# Patient Record
Sex: Female | Born: 1940 | Race: White | Hispanic: No | Smoking: Never smoker
Health system: Southern US, Community
[De-identification: ages and names within clinical notes are randomized; demographics above are authoritative.]

## PROBLEM LIST (undated history)

## (undated) DIAGNOSIS — E079 Disorder of thyroid, unspecified: Secondary | ICD-10-CM

## (undated) DIAGNOSIS — I341 Nonrheumatic mitral (valve) prolapse: Secondary | ICD-10-CM

## (undated) DIAGNOSIS — I480 Paroxysmal atrial fibrillation: Secondary | ICD-10-CM

## (undated) DIAGNOSIS — N289 Disorder of kidney and ureter, unspecified: Secondary | ICD-10-CM

## (undated) DIAGNOSIS — I10 Essential (primary) hypertension: Secondary | ICD-10-CM

## (undated) DIAGNOSIS — Z8679 Personal history of other diseases of the circulatory system: Secondary | ICD-10-CM

## (undated) DIAGNOSIS — I340 Nonrheumatic mitral (valve) insufficiency: Secondary | ICD-10-CM

## (undated) HISTORY — DX: Paroxysmal atrial fibrillation: I48.0

## (undated) HISTORY — DX: Essential (primary) hypertension: I10

## (undated) HISTORY — DX: Nonrheumatic mitral (valve) prolapse: I34.1

## (undated) HISTORY — DX: Disorder of thyroid, unspecified: E07.9

## (undated) HISTORY — PX: ABDOMINAL HYSTERECTOMY: SHX81

## (undated) HISTORY — DX: Personal history of other diseases of the circulatory system: Z86.79

## (undated) HISTORY — DX: Nonrheumatic mitral (valve) insufficiency: I34.0

---

## 2009-02-13 ENCOUNTER — Ambulatory Visit: Payer: Self-pay | Admitting: Thoracic Surgery (Cardiothoracic Vascular Surgery)

## 2009-02-21 ENCOUNTER — Encounter
Admission: RE | Admit: 2009-02-21 | Discharge: 2009-02-21 | Payer: Self-pay | Admitting: Thoracic Surgery (Cardiothoracic Vascular Surgery)

## 2009-02-21 IMAGING — CT CT ANGIO CHEST
1 of 6 series · 11 of 30 positions shown · IV contrast ([ID] OMNI 350)
Comparison: Nine

CTA CHEST

CLINICAL DATA: Preop for mitral valve replacement.  Evaluate for
aneurysm, peripheral arterial disease.  Cough, shortness of breath.
Nonsmoker.

CT ANGIOGRAPHY CHEST, ABDOMEN AND PELVIS
TECHNIQUE: Multidetector CT imaging through the chest, abdomen and
pelvis was performed using the standard protocol during bolus
administration of intravenous contrast. Multiplanar reconstructed
images including MIPs were obtained and reviewed to evaluate the
vascular anatomy.
Contrast: 100 ml Omnipaque 350 IV

[Series 5: angio · axial · 0.64mm/px · z∈[-492,-19]mm · 11 of 227 slices shown]
[im 19/227  lung]
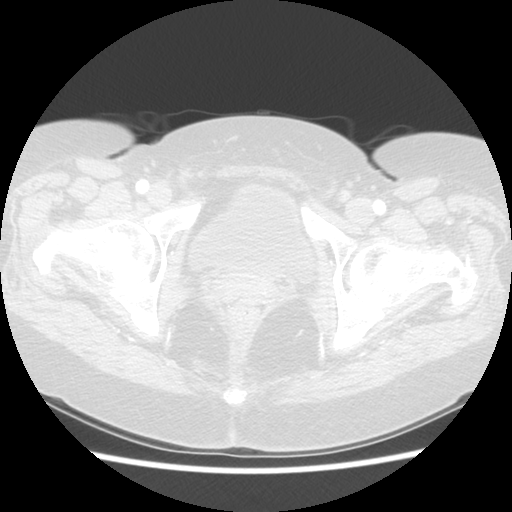
[im 38/227  mediastinal]
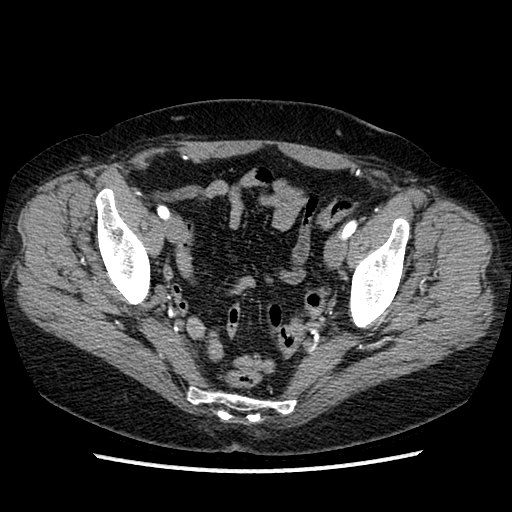
[im 57/227  lung]
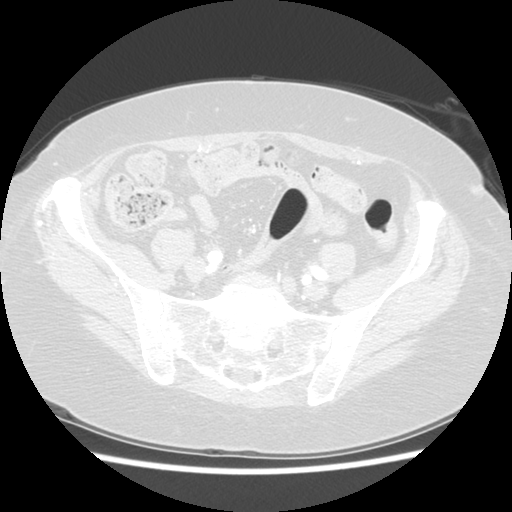
[im 76/227  mediastinal]
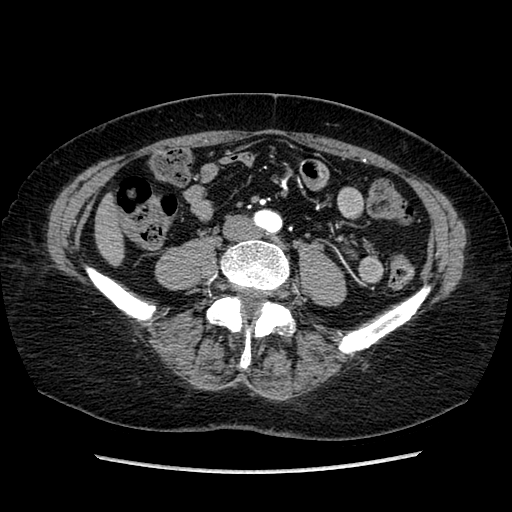
[im 95/227  lung]
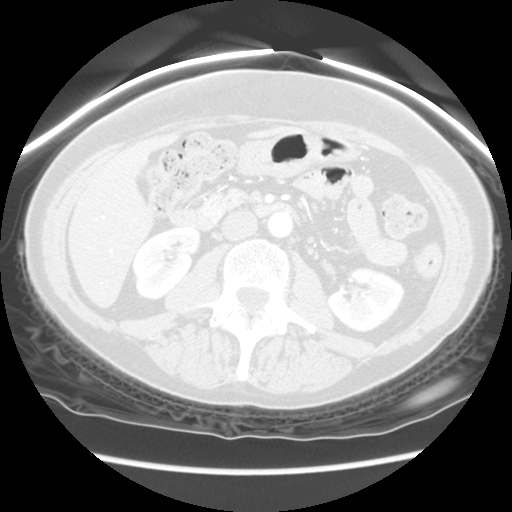
[im 114/227  mediastinal]
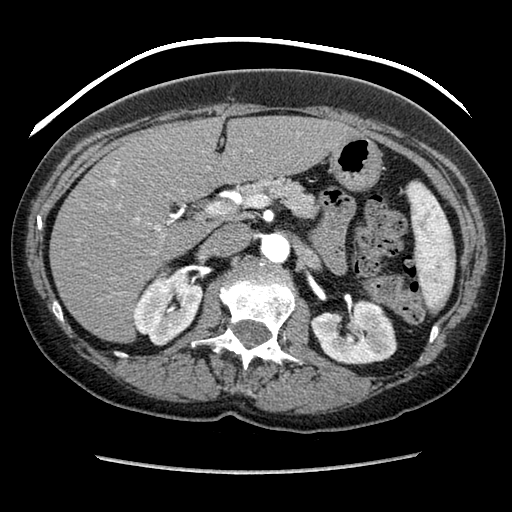
[im 132/227  lung]
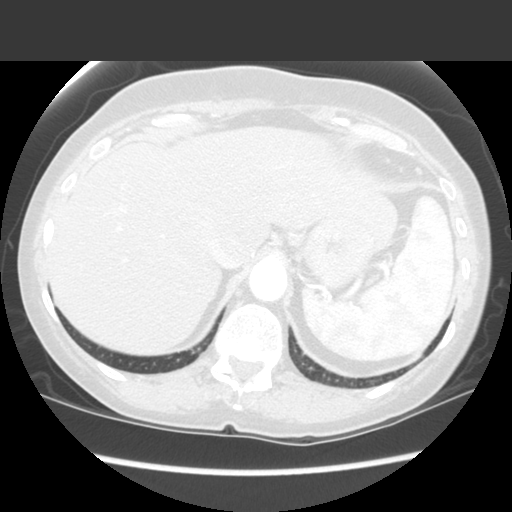
[im 151/227  mediastinal]
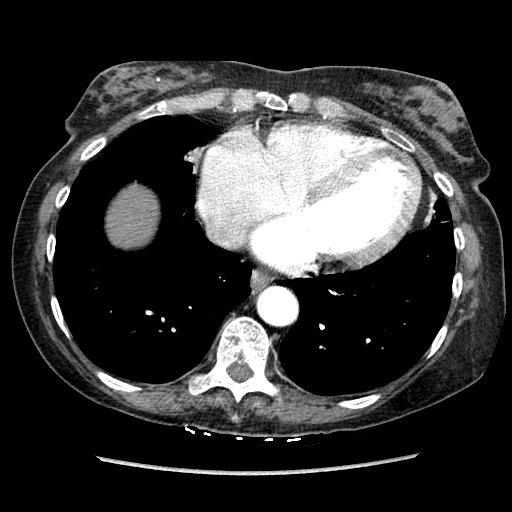
[im 170/227  lung]
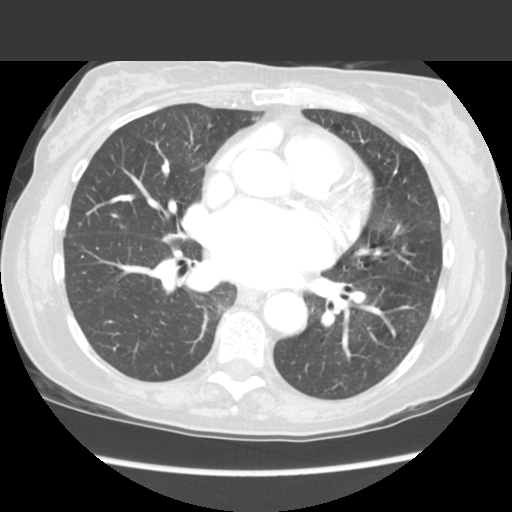
[im 189/227  mediastinal]
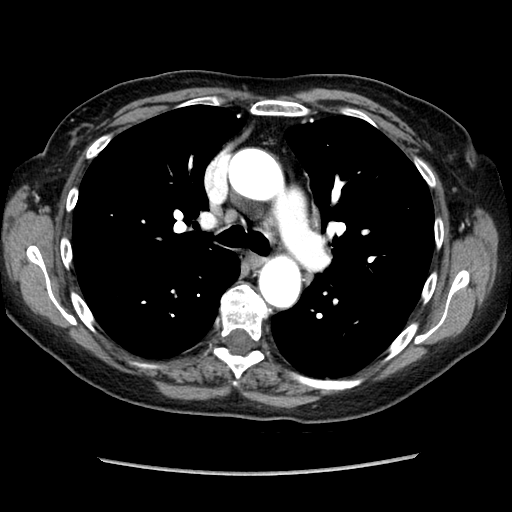
[im 208/227  lung]
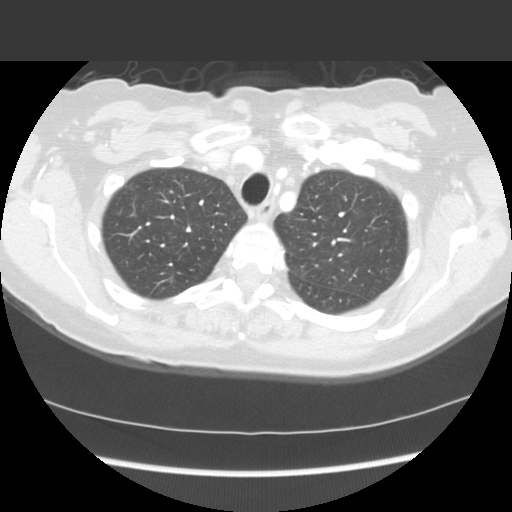

[11 of 30 positions shown; findings below may reference images not displayed]

FINDINGS: The thoracic aorta is nonaneurysmal.  Maximum diameter
of the ascending thoracic aorta is 3.0 cm.  Maximal diameter of the
aortic arch is 2.8 cm.  Maximal diameter of the descending thoracic
aorta is 2.6 cm.  No dissection. No significant atherosclerotic
disease.

There is mild cardiomegaly.  No filling defects in the pulmonary
arteries to suggest pulmonary emboli.  No effusions. No
mediastinal, hilar, or axillary adenopathy.  Small scattered
mediastinal lymph nodes, none pathologically enlarged.

Minimal ground-glass opacities noted in the lingula and in the
right azygo-esophageal recess, likely scarring or atelectasis.  No
acute infiltrates.

 Review of the MIP images confirms the above findings.
IMPRESSION: Thoracic aorta is nonaneurysmal.  No dissection.

No acute findings in the chest.

CTA ABDOMEN
FINDINGS: 2 right renal arteries and one left renal artery noted,
widely patent.  Celiac artery, superior mesenteric artery, inferior
mesenteric artery widely patent.  Abdominal aorta is nonaneurysmal.
Maximum diameter is proximal at 2.2 cm.  No dissection. No
significant atherosclerotic change.

Liver, spleen, pancreas, adrenals, kidneys unremarkable. No
hydronephrosis. No biliary ductal dilatation. Gallbladder
unremarkable. Bowel grossly unremarkable.  No free fluid, free air,
or adenopathy.

No acute bony abnormality.  Degenerative changes in the lumbar
spine.

 Review of the MIP images confirms the above findings.
IMPRESSION: No evidence of aortic aneurysm or dissection.

No acute findings.

CTA PELVIS
FINDINGS: Common iliac arteries, internal iliac arteries, external
iliac arteries, common femoral arteries widely patent.  No aneurysm
or dissection. No significant atherosclerotic disease.

Bowel grossly unremarkable.  No free fluid, free air, or
adenopathy. The patient is status post hysterectomy.  No adnexal
masses.  Bladder unremarkable.

 Review of the MIP images confirms the above findings.
IMPRESSION: No acute findings in the pelvis.

## 2009-02-25 ENCOUNTER — Encounter: Payer: Self-pay | Admitting: Thoracic Surgery (Cardiothoracic Vascular Surgery)

## 2009-02-25 ENCOUNTER — Ambulatory Visit: Payer: Self-pay | Admitting: Thoracic Surgery (Cardiothoracic Vascular Surgery)

## 2009-02-25 IMAGING — CR DG CHEST 2V
2 series · 2 of 2 positions shown · non-contrast
Comparison: CT chest [DATE]

CLINICAL DATA: Preop mitral valve repair.

CHEST - 2 VIEW

[view not recorded (1 of 2)]
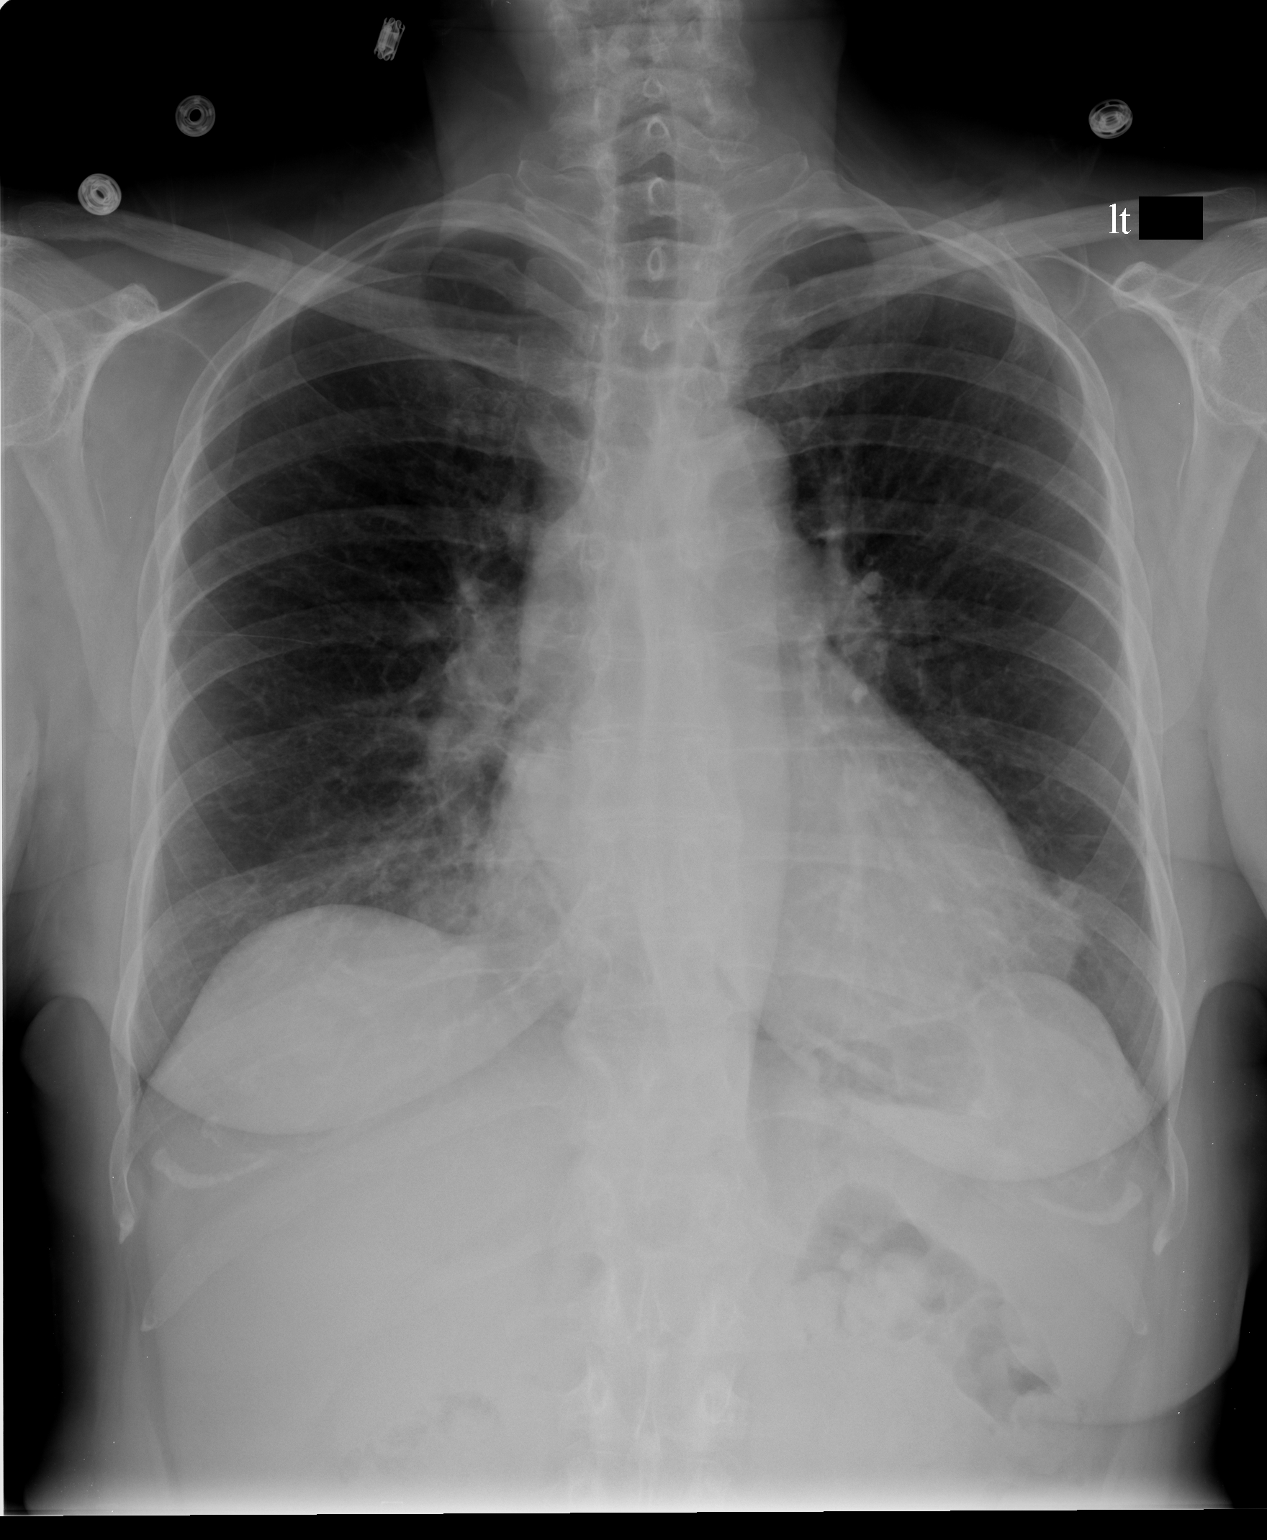

[view not recorded (2 of 2)]
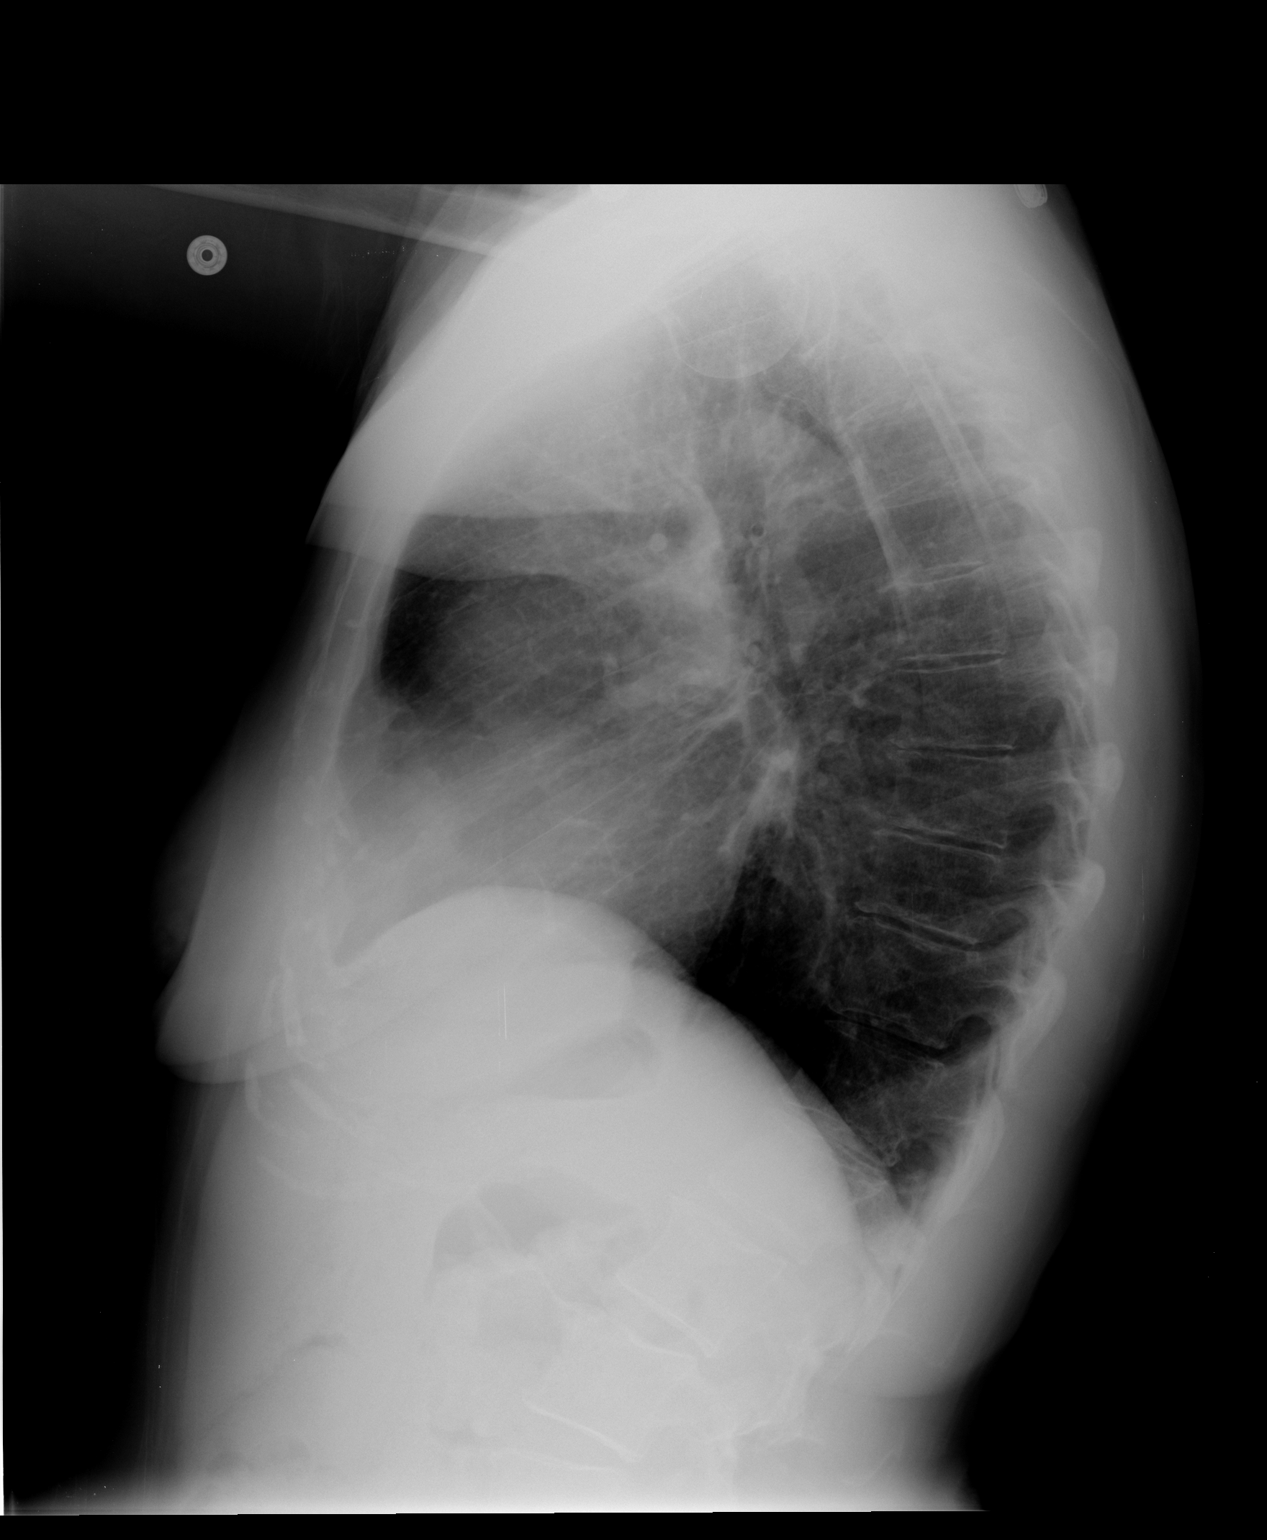

[2 of 2 positions shown; findings below may reference images not displayed]

FINDINGS: Trachea is midline.  Heart is enlarged.  Pulmonary
arteries are prominent.  Minimal bibasilar atelectasis and/or
scarring. No pleural fluid.
IMPRESSION: 1.  Minimal dependent atelectasis and/or scarring.
2.  Pulmonary arterial hypertension.

## 2009-02-28 ENCOUNTER — Ambulatory Visit: Payer: Self-pay | Admitting: Cardiovascular Disease

## 2009-02-28 ENCOUNTER — Encounter: Payer: Self-pay | Admitting: Thoracic Surgery (Cardiothoracic Vascular Surgery)

## 2009-02-28 ENCOUNTER — Ambulatory Visit: Payer: Self-pay | Admitting: Thoracic Surgery (Cardiothoracic Vascular Surgery)

## 2009-02-28 ENCOUNTER — Inpatient Hospital Stay (HOSPITAL_COMMUNITY)
Admission: RE | Admit: 2009-02-28 | Discharge: 2009-03-06 | Payer: Self-pay | Admitting: Thoracic Surgery (Cardiothoracic Vascular Surgery)

## 2009-02-28 ENCOUNTER — Ambulatory Visit: Payer: Self-pay | Admitting: Cardiology

## 2009-02-28 HISTORY — PX: COX-MAZE MICROWAVE ABLATION: SHX1404

## 2009-02-28 IMAGING — CR DG CHEST 1V PORT
1 series · 1 of 1 positions shown · non-contrast
Comparison: Chest radiograph performed [DATE].

CLINICAL DATA: Chest tube placement status post CABG; mitral
regurgitation and atrial fibrillation.

PORTABLE CHEST - 1 VIEW

[view not recorded]
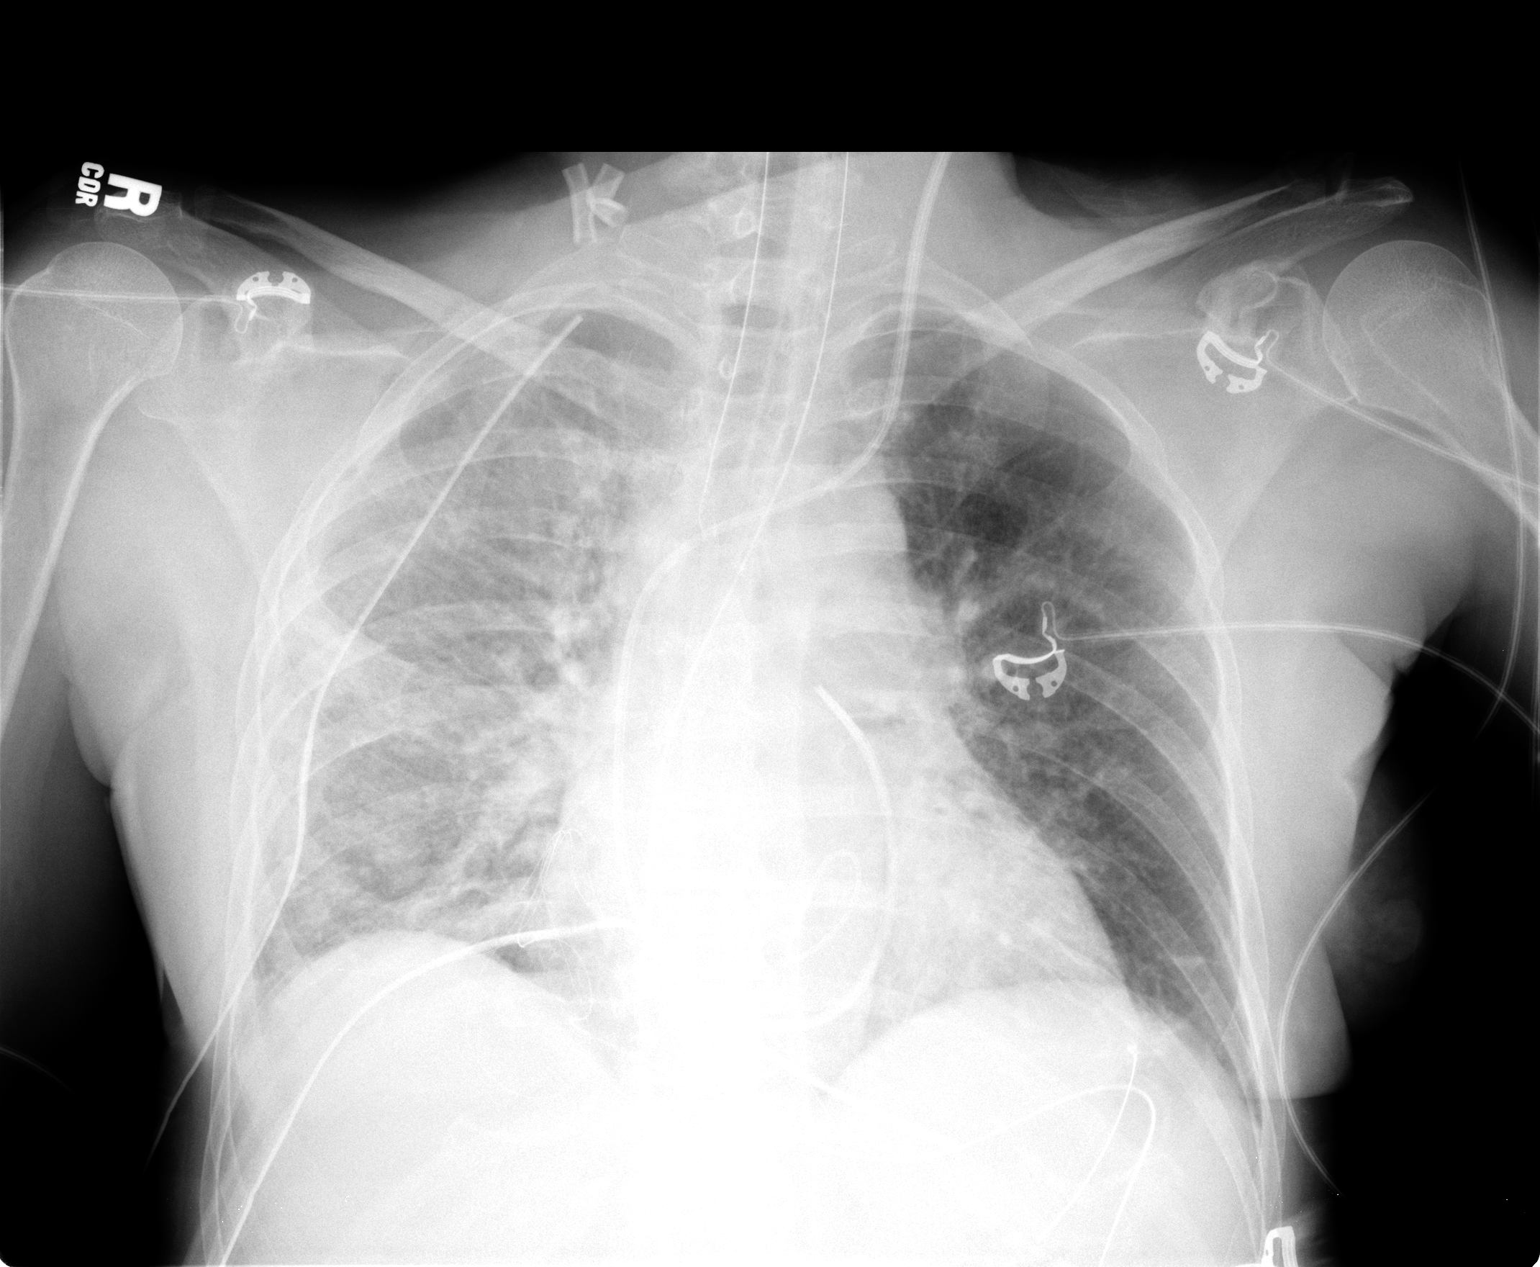

[1 of 1 positions shown; findings below may reference images not displayed]

FINDINGS: There has been interval placement of an endotracheal
tube, which is seen ending 1-2 cm above the carina.  A right-sided
chest tube and a pericardial drain are seen, ending overlying the
right lung apex and cardiac apex.  A left IJ Swan-Ganz catheter is
noted ending at the pulmonary outflow tract.  A nasogastric tube is
seen extending into the body of the stomach.

A mitral valve replacement is appreciated.  External pacer wires
are noted.

The lungs are mildly hypoexpanded.  The patient was imaged supine;
diffusely increased density of the right hemithorax likely reflects
a small right-sided layering pleural effusion.  There is mild
prominence of the pulmonary vasculature in comparison to the
preoperative study, which suggests mild interstitial edema.  The
degree of vascular prominence is mildly more accentuated on the
right side.  No definite pneumothorax is identified. No prominent
focal consolidation is seen.

The cardiomediastinal silhouette is stable in appearance.  No acute
osseous abnormalities are seen.
IMPRESSION: 1.  Endotracheal tube seen ending 1-2 cm above the carina.
2.  Right apical chest tube and pericardial drain noted in expected
positions.
3.  Left IJ Swan-Ganz catheter seen ending in the pulmonary outflow
tract.
4.  New layering right-sided pleural effusion; mild interstitial
edema, more prominent on the right side.
5.  No evidence of pneumothorax or focal consolidation.

## 2009-03-01 ENCOUNTER — Encounter: Payer: Self-pay | Admitting: Thoracic Surgery (Cardiothoracic Vascular Surgery)

## 2009-03-01 IMAGING — CR DG CHEST 1V PORT
1 series · 1 of 1 positions shown · non-contrast
Comparison: [DATE]

CLINICAL DATA: Right interstitial edema.  Status post mitral valve
replacement.

PORTABLE CHEST - 1 VIEW

[AP]
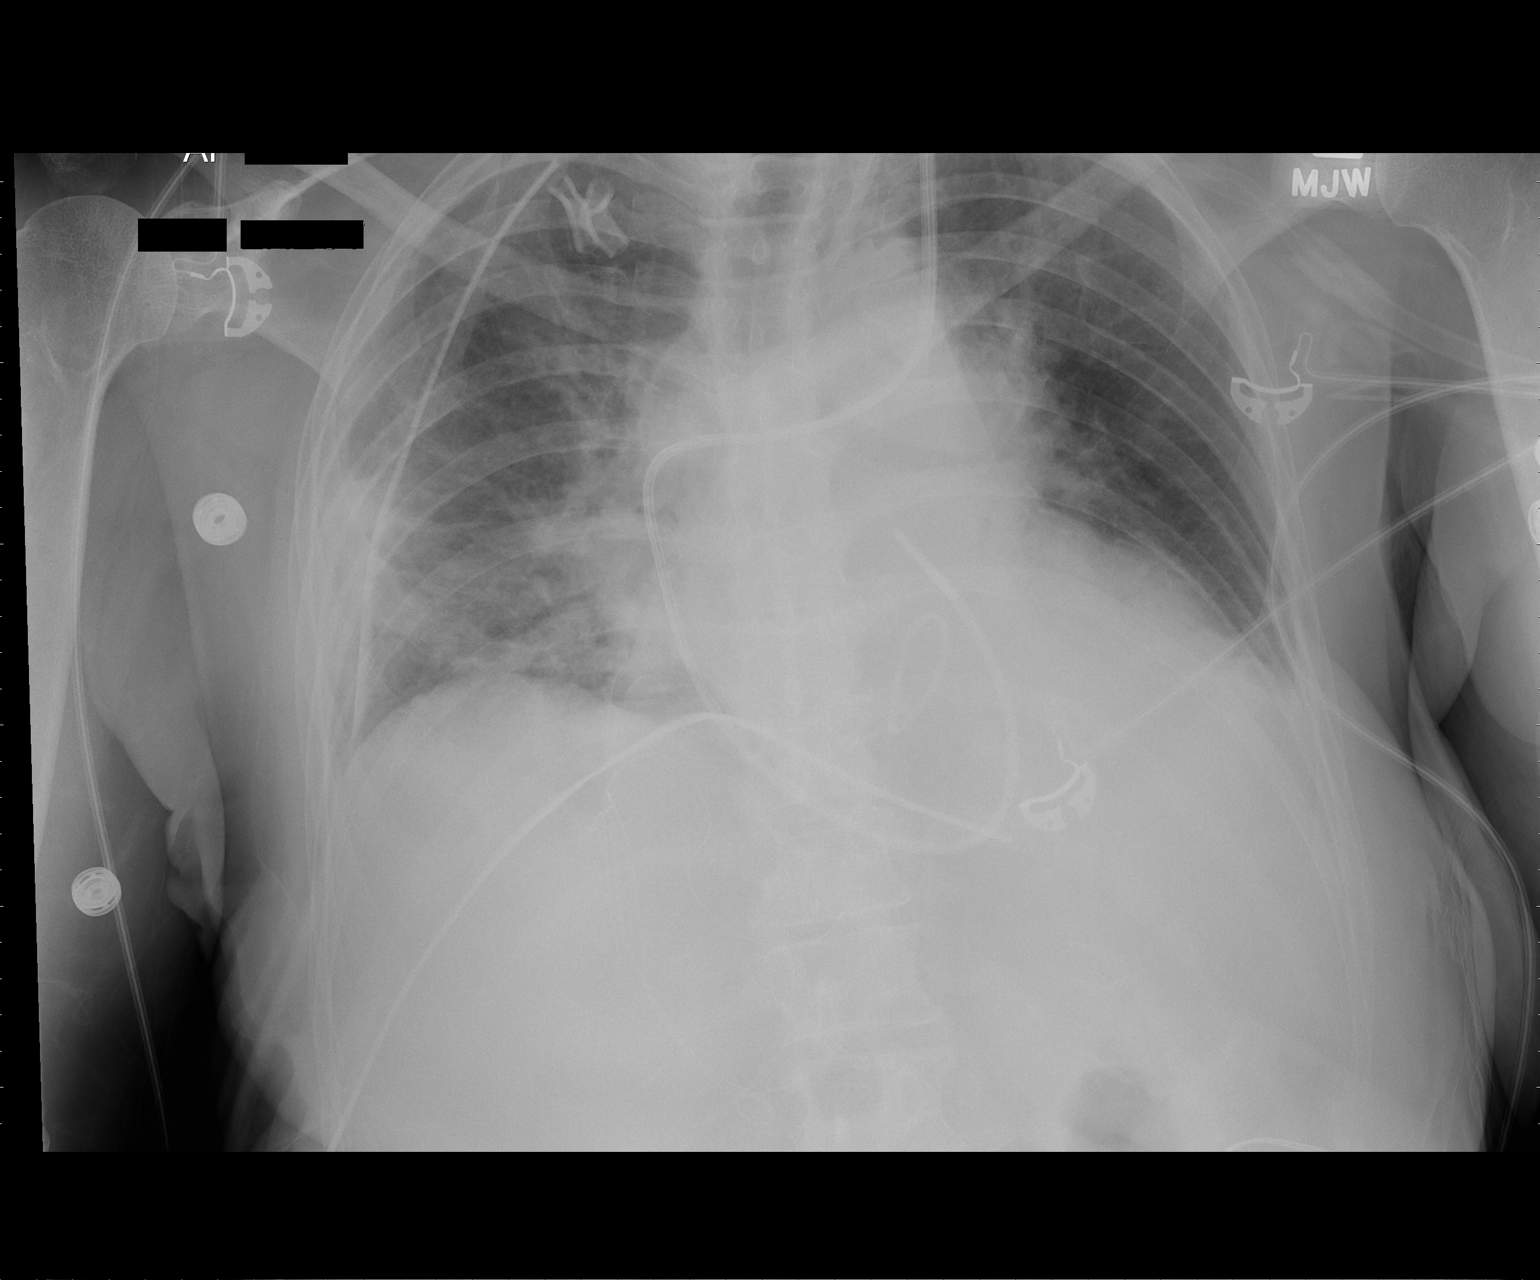

[1 of 1 positions shown; findings below may reference images not displayed]

FINDINGS: The endotracheal tube and NG tube have been removed.
Swan-Ganz catheter tip remains in the main pulmonary artery.  Right-
sided chest tube is unchanged with no pneumothorax.

The interstitial edema has resolved on the right. There is mild
atelectasis in the right  mid and lower lung zones.

The patient has developed a small left effusion with slight
atelectasis at the left base.
IMPRESSION: 1.  New small left effusion and left base atelectasis.
2.  Resolution of the right sided interstitial edema.
3.  Atelectasis in the right mid and lower lung zones.

## 2009-03-02 IMAGING — CR DG CHEST 1V PORT
1 series · 1 of 1 positions shown · non-contrast
Comparison: Portable chest x-ray of [DATE]

CLINICAL DATA: CABG, follow-up

PORTABLE CHEST - 1 VIEW

[AP]
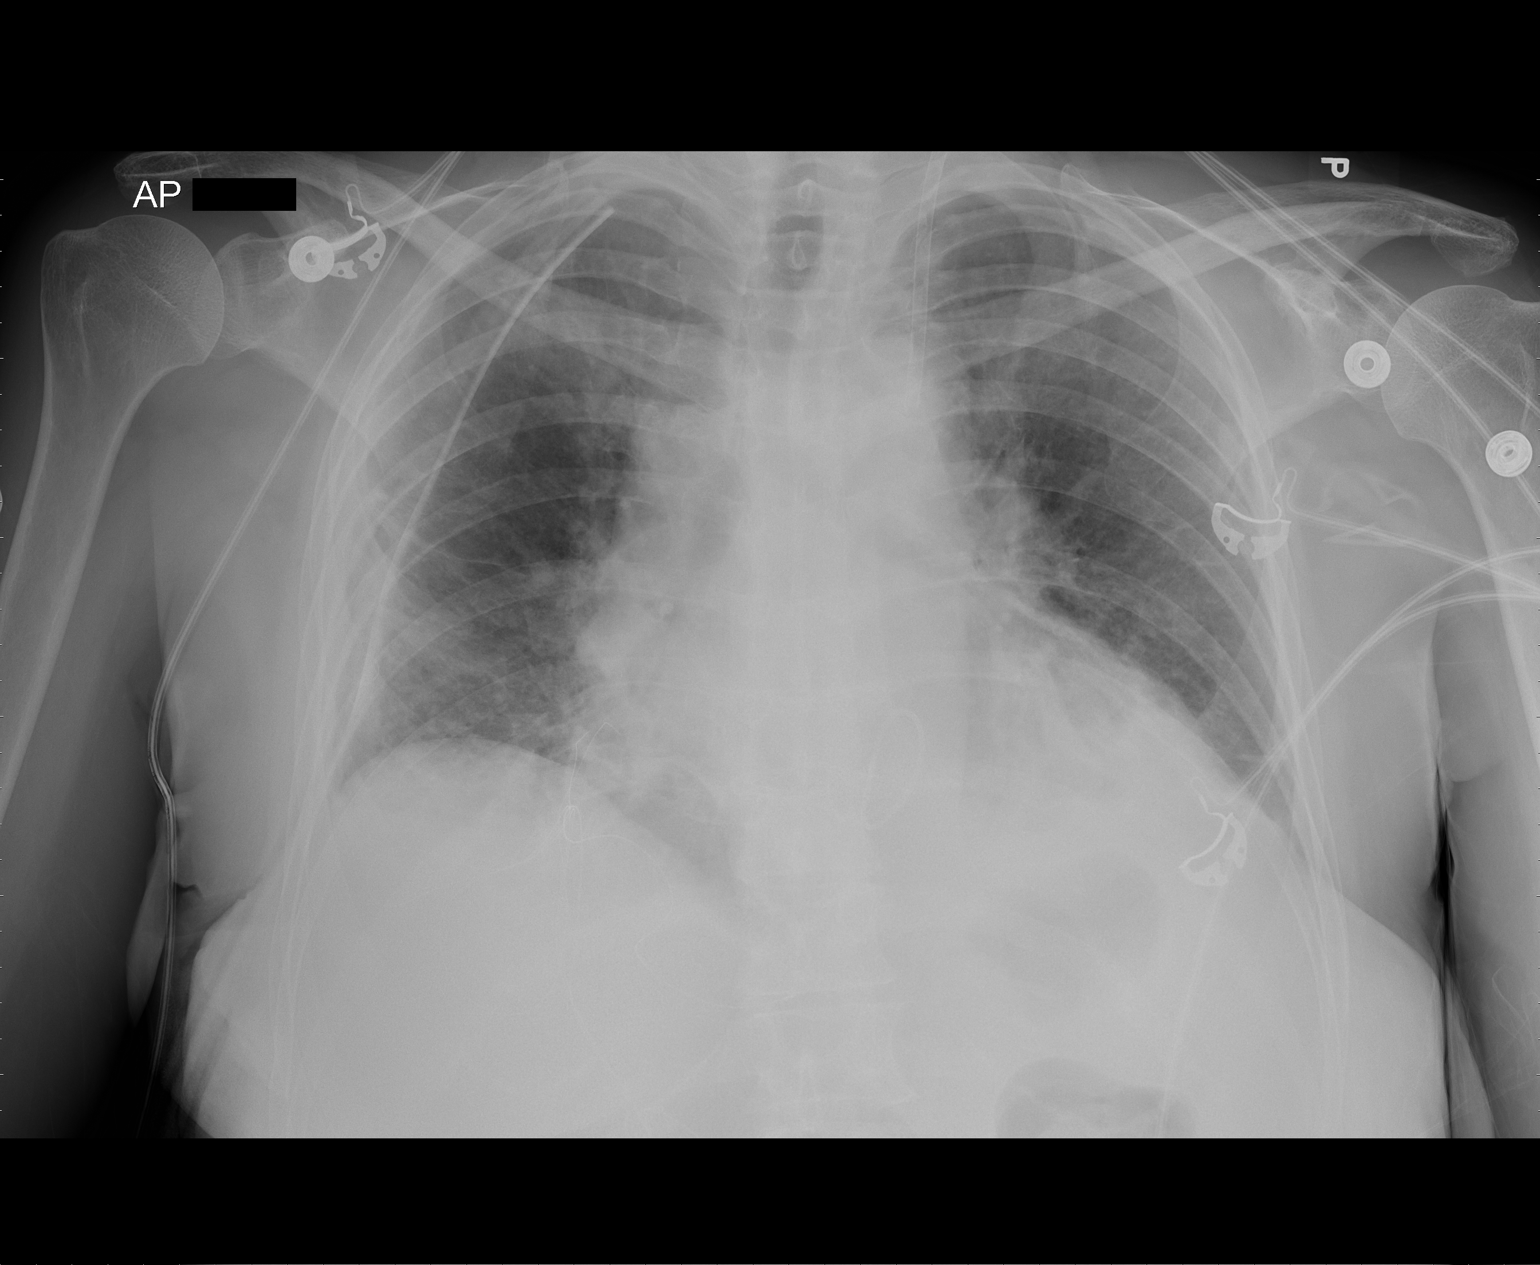

[1 of 1 positions shown; findings below may reference images not displayed]

FINDINGS: Aeration has improved slightly.  The Swan-Ganz catheter
has been removed.  A right chest tube remains and no pneumothorax
is seen.  A venous sheath remains in the left internal jugular vein
IMPRESSION: 1.  Improved aeration.
2.  Swan-Ganz catheter removed.
3.  No pneumothorax.

## 2009-03-03 IMAGING — CR DG CHEST 1V PORT
1 series · 1 of 1 positions shown · non-contrast
Comparison: [DATE]/[PHONE_NUMBER] hours

CLINICAL DATA: Mitral regurgitation

PORTABLE CHEST - 1 VIEW

[AP]
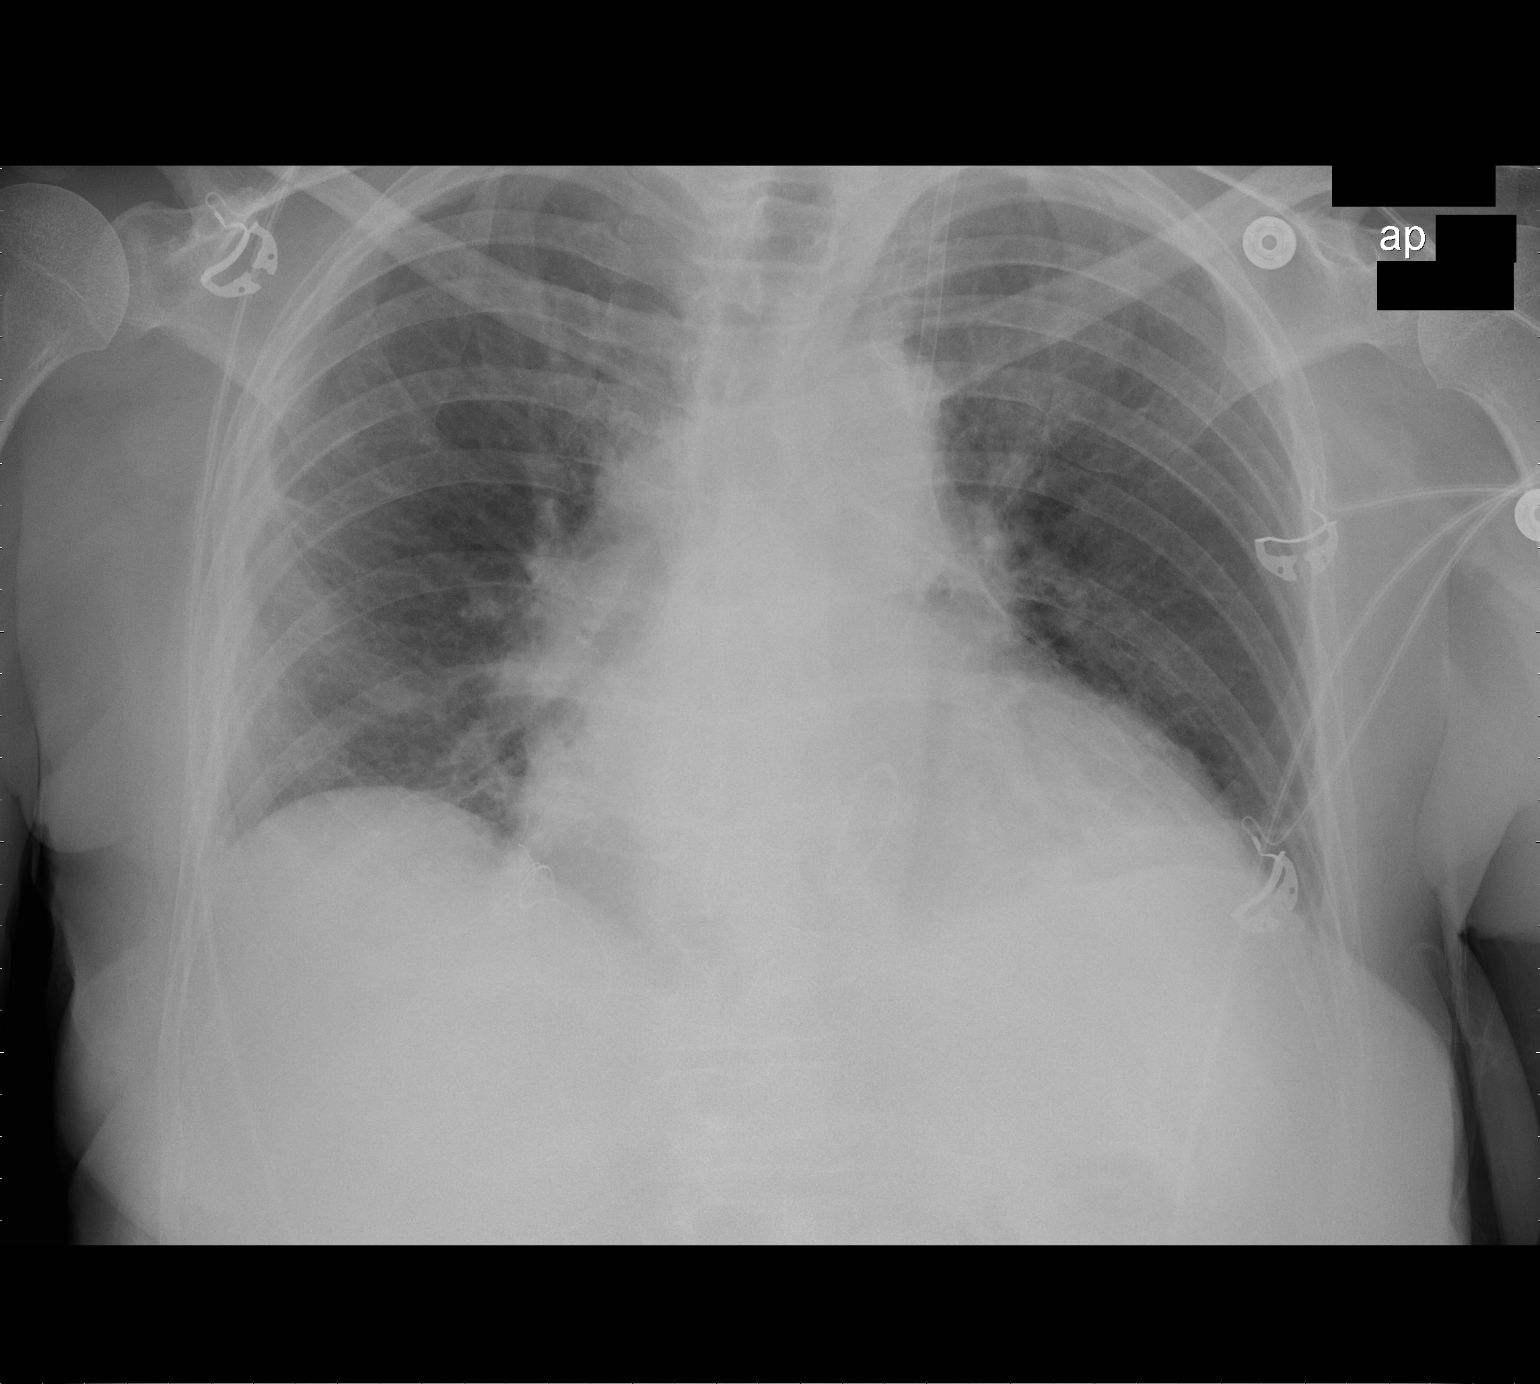

[1 of 1 positions shown; findings below may reference images not displayed]

FINDINGS: Right chest tube removal.  No pneumothorax.
Cardiomegaly.  Vascular congestion.  Introducer in the left
internal jugular is stable.  Stable pacemaker wires.
IMPRESSION: Right chest tube removal without pneumothorax.  Stable vascular
congestion.

## 2009-03-03 IMAGING — CR DG CHEST 1V PORT
1 series · 1 of 1 positions shown · non-contrast
Comparison: Portable chest x-ray of [DATE]

CLINICAL DATA: Postop surgery for mitral regurgitation, follow-up

PORTABLE CHEST - 1 VIEW

[view not recorded]
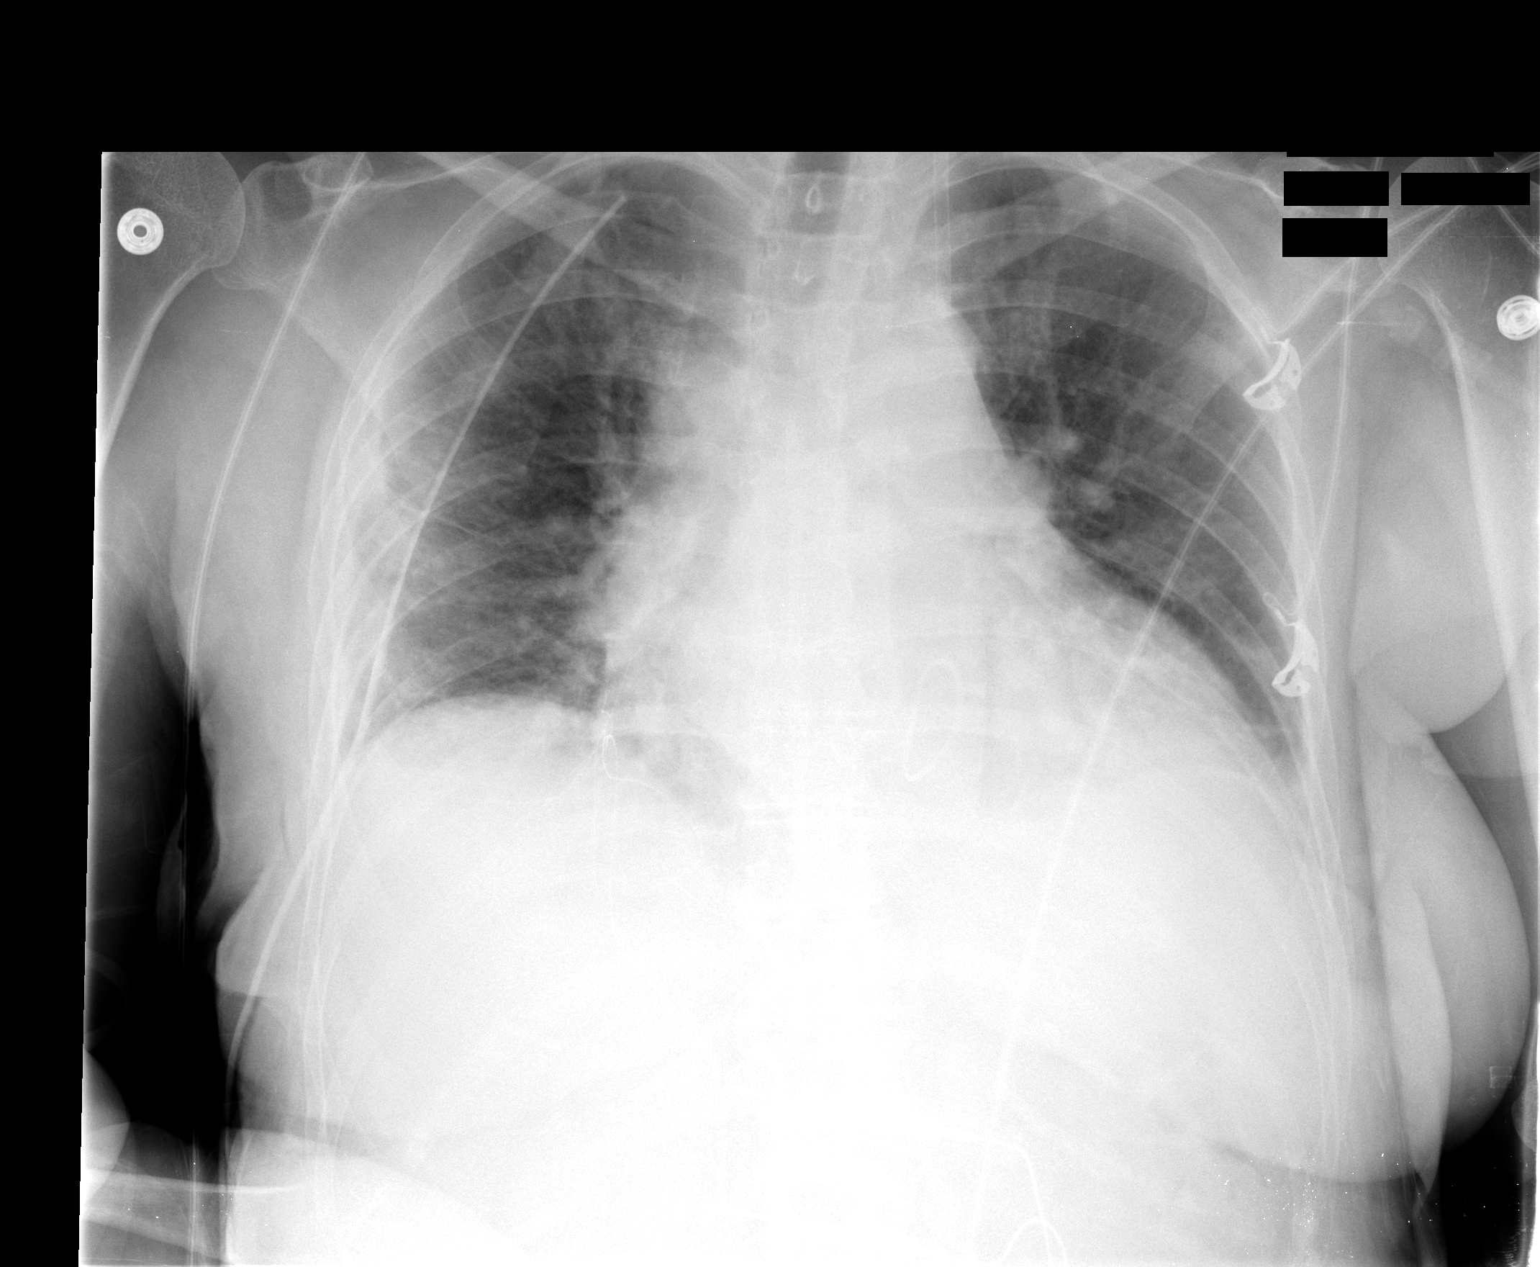

[1 of 1 positions shown; findings below may reference images not displayed]

FINDINGS: There is cardiomegaly present with probable mild
pulmonary vascular congestion.  Right chest tube is present and no
pneumothorax is seen. No bony abnormality is seen.  A venous sheath
remains in the left internal jugular vein.
IMPRESSION: Cardiomegaly with question of mild pulmonary vascular congestion.

## 2009-03-04 ENCOUNTER — Encounter: Payer: Self-pay | Admitting: Thoracic Surgery (Cardiothoracic Vascular Surgery)

## 2009-03-05 IMAGING — CR DG CHEST 2V
2 series · 2 of 2 positions shown · non-contrast
Comparison: [DATE]

CLINICAL DATA: Mitral valve replacement.  Chest tube removal.

CHEST - 2 VIEW

[w chest pa]
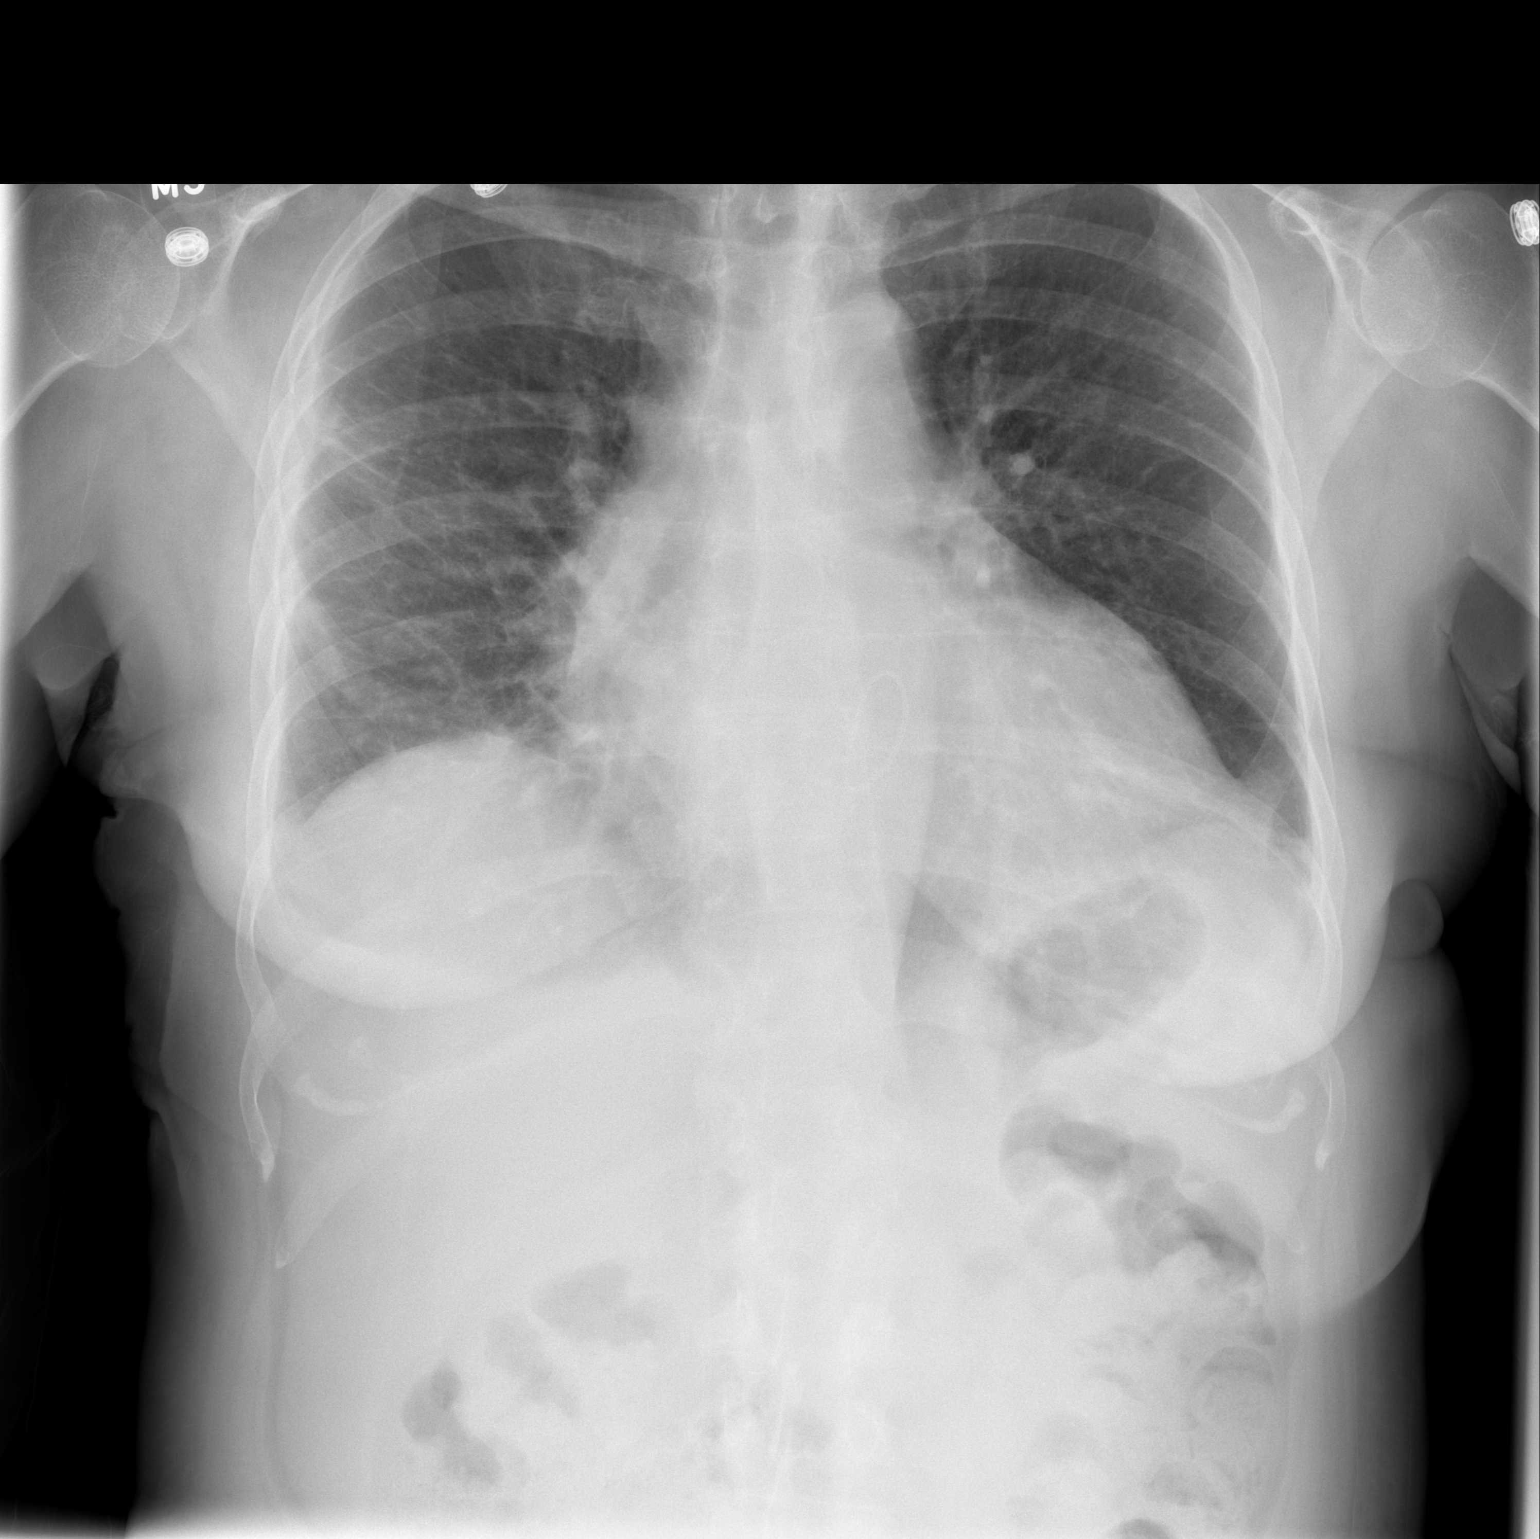

[w chest lat]
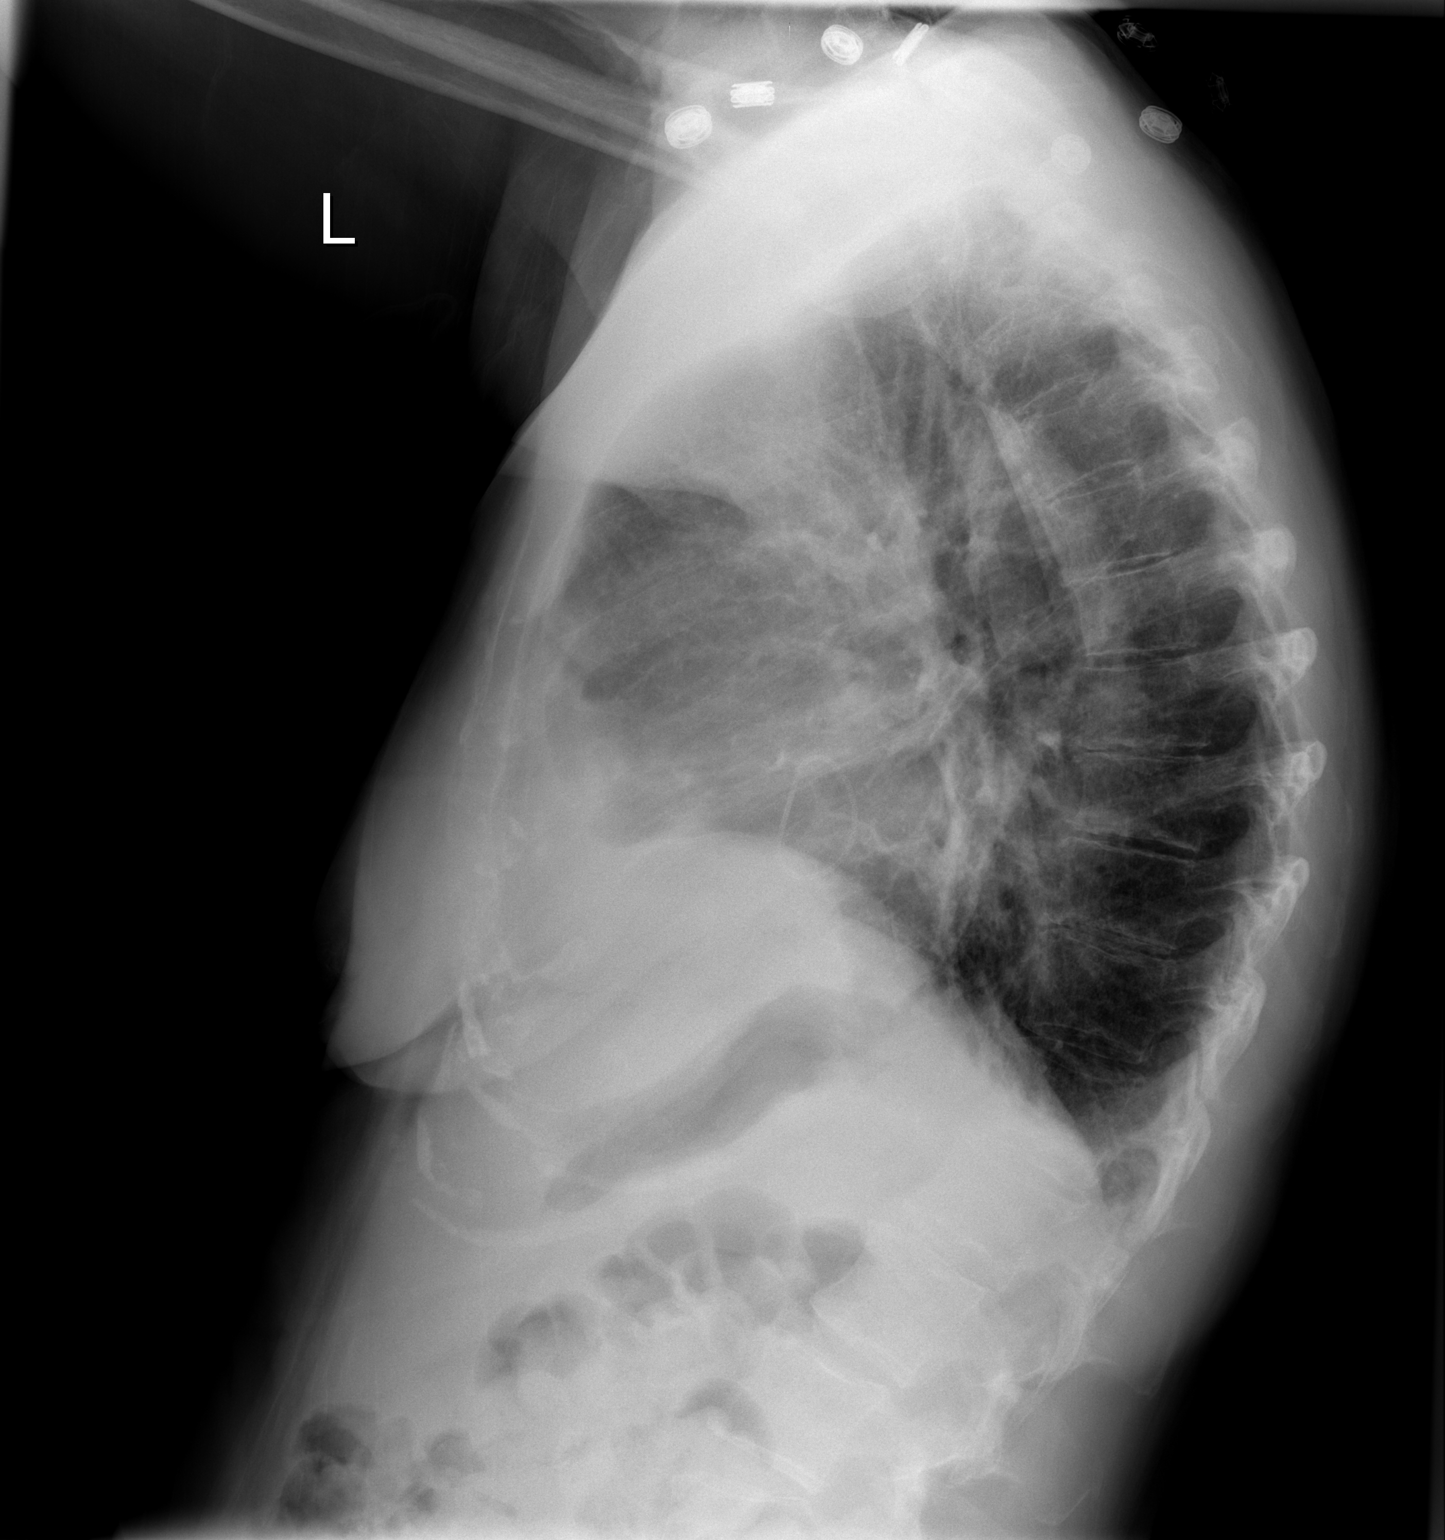

[2 of 2 positions shown; findings below may reference images not displayed]

FINDINGS: Cardiomegaly and evidence of cardiac valve replacement
noted.
A left IJ central venous catheter sheath has been removed.
Mild bibasilar atelectasis is stable.
There is no evidence of pneumothorax.
No definite pleural effusions are identified.
IMPRESSION: Cardiomegaly with mild bibasilar atelectasis - no evidence of
pneumothorax.

Left IJ central venous catheter sheath removal.

## 2009-03-11 ENCOUNTER — Ambulatory Visit: Payer: Self-pay | Admitting: Thoracic Surgery (Cardiothoracic Vascular Surgery)

## 2009-03-18 ENCOUNTER — Encounter
Admission: RE | Admit: 2009-03-18 | Discharge: 2009-03-18 | Payer: Self-pay | Admitting: Thoracic Surgery (Cardiothoracic Vascular Surgery)

## 2009-03-18 ENCOUNTER — Ambulatory Visit: Payer: Self-pay | Admitting: Thoracic Surgery (Cardiothoracic Vascular Surgery)

## 2009-03-18 IMAGING — CR DG CHEST 2V
2 series · 2 of 2 positions shown · non-contrast
Comparison: [DATE]

CLINICAL DATA: Mitral valve replacement, chest tube removal.
Cough.

CHEST - 2 VIEW

[w chest pa]
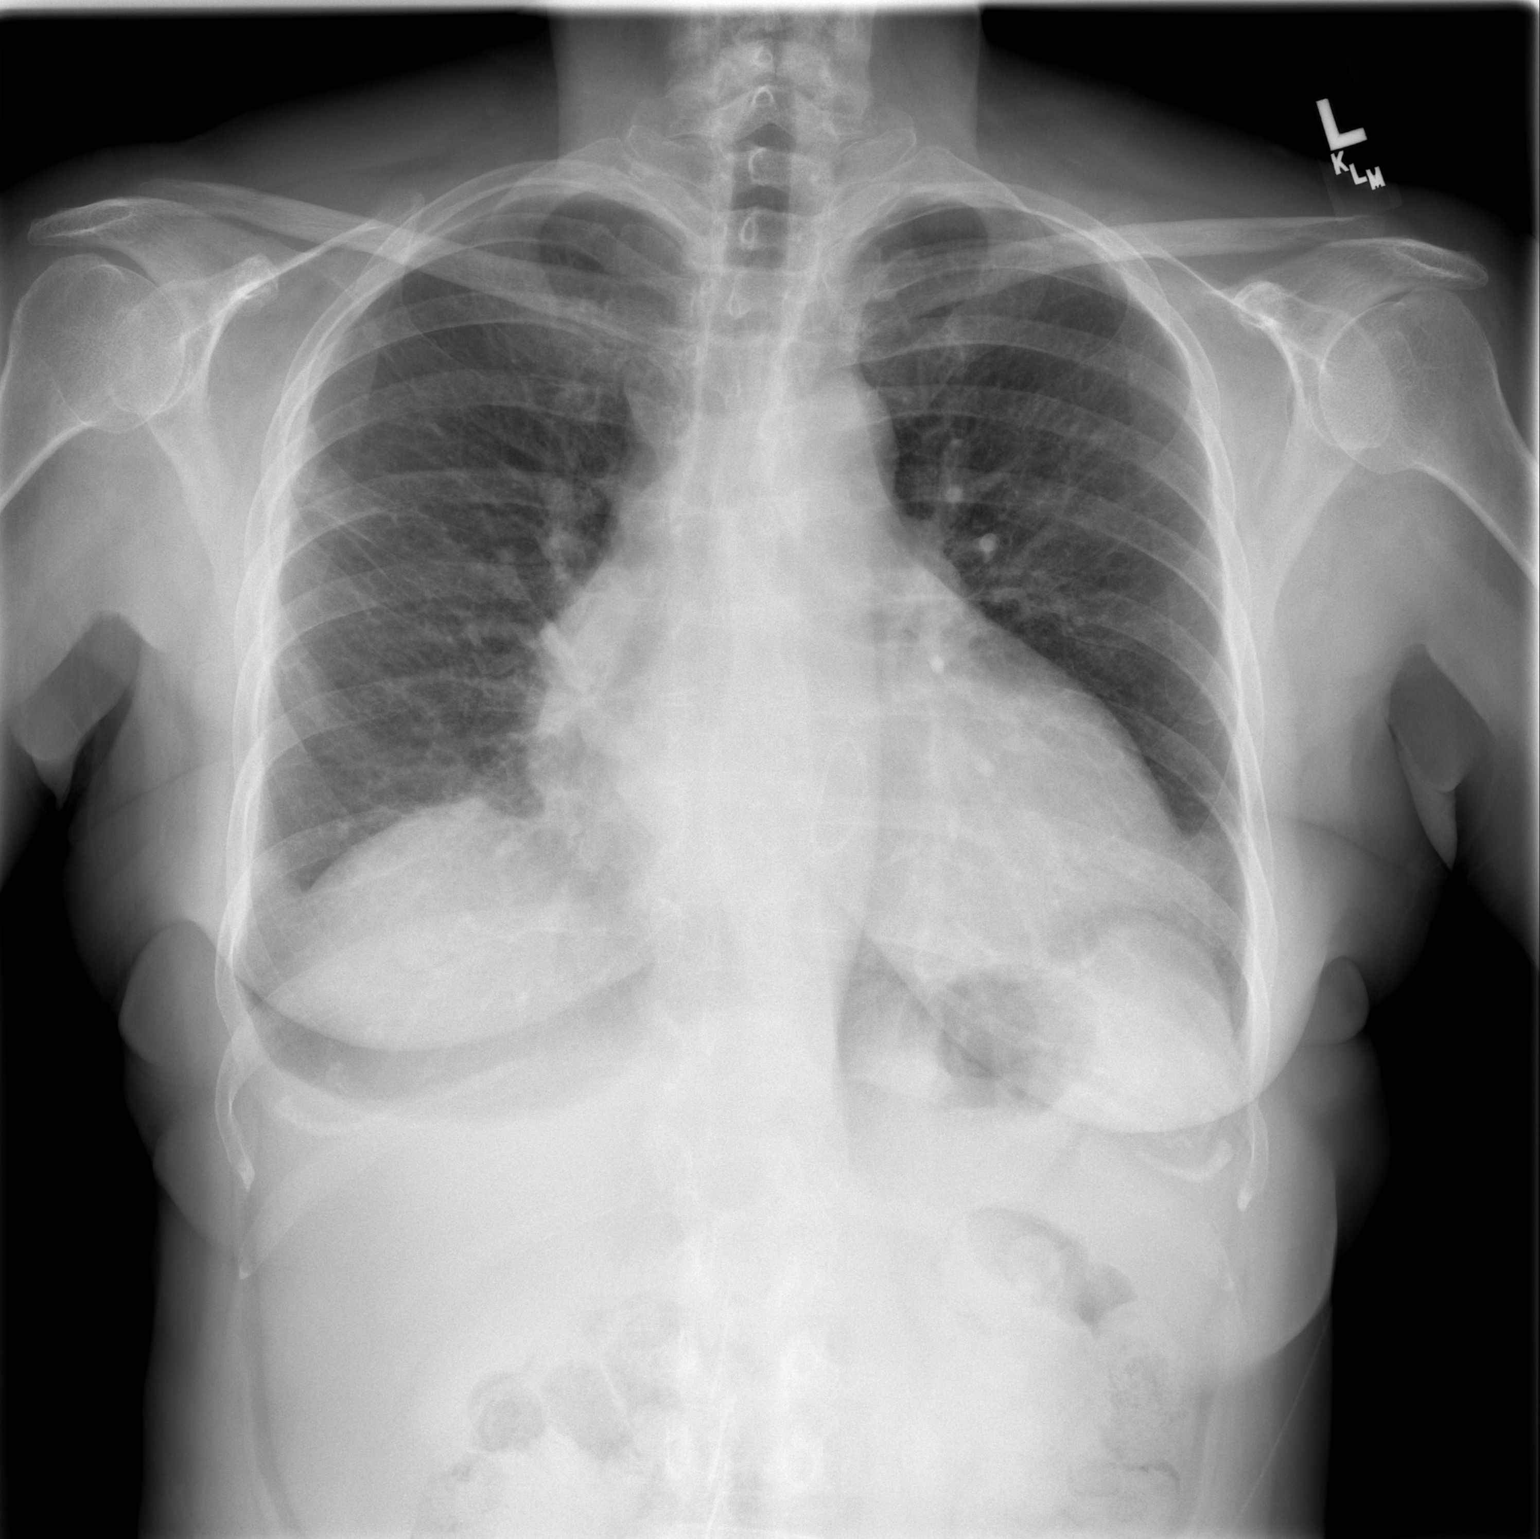

[w chest lat]
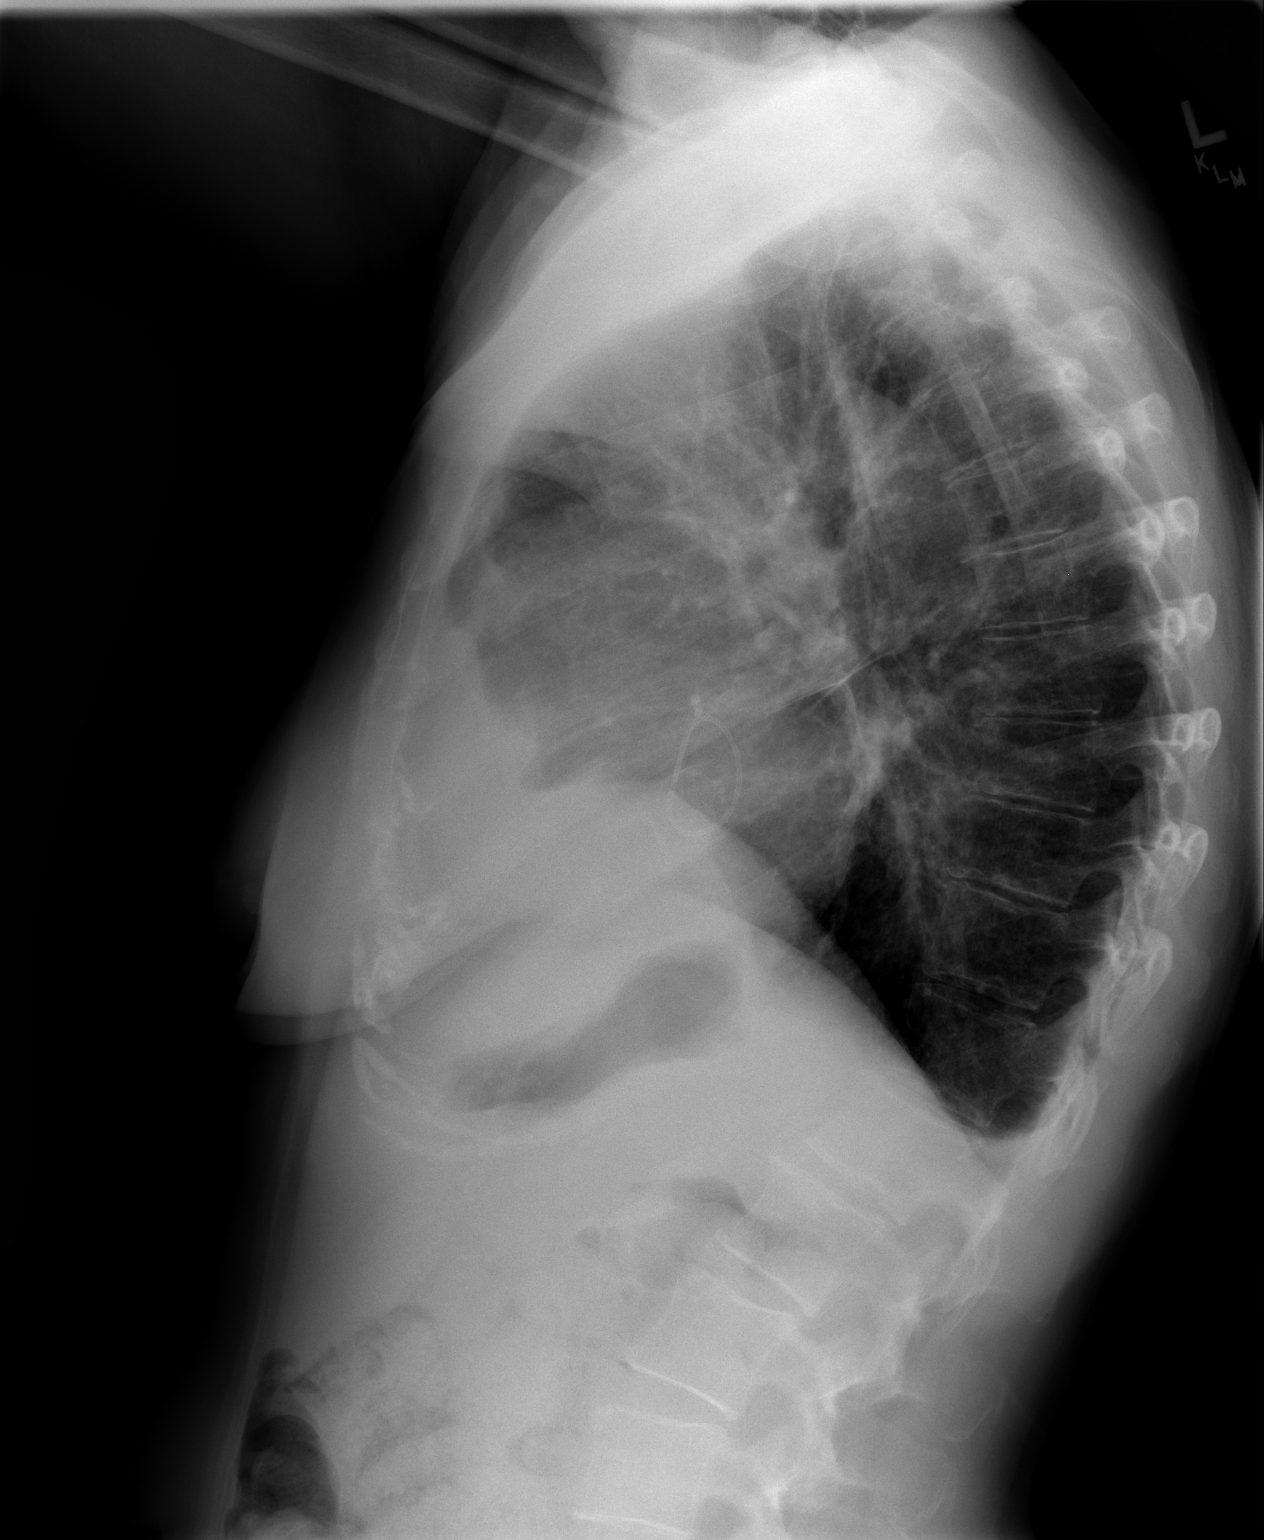

[2 of 2 positions shown; findings below may reference images not displayed]

FINDINGS: Trachea is midline.  Heart is enlarged.  Scattered mild
scarring and/or atelectasis.  Tiny right pleural effusion.  No
edema.  No pneumothorax.  Degenerative changes are seen in the
spine.
IMPRESSION: Tiny right pleural effusion with scattered atelectasis and/or
scarring.

## 2009-05-13 ENCOUNTER — Ambulatory Visit: Payer: Self-pay | Admitting: Thoracic Surgery (Cardiothoracic Vascular Surgery)

## 2009-09-30 ENCOUNTER — Ambulatory Visit: Payer: Self-pay | Admitting: Thoracic Surgery (Cardiothoracic Vascular Surgery)

## 2010-04-28 ENCOUNTER — Ambulatory Visit: Payer: Self-pay | Admitting: Thoracic Surgery (Cardiothoracic Vascular Surgery)

## 2010-11-08 LAB — CBC
Hemoglobin: 9.1 g/dL — ABNORMAL LOW (ref 12.0–15.0)
Platelets: 157 10*3/uL (ref 150–400)
RBC: 2.92 MIL/uL — ABNORMAL LOW (ref 3.87–5.11)
RDW: 13.1 % (ref 11.5–15.5)
RDW: 13.6 % (ref 11.5–15.5)

## 2010-11-08 LAB — BASIC METABOLIC PANEL
BUN: 23 mg/dL (ref 6–23)
Calcium: 7.2 mg/dL — ABNORMAL LOW (ref 8.4–10.5)
Calcium: 7.7 mg/dL — ABNORMAL LOW (ref 8.4–10.5)
GFR calc Af Amer: >60 mL/min (ref >60)
GFR calc Af Amer: >60 mL/min (ref >60)
GFR calc non Af Amer: 44 mL/min — ABNORMAL LOW (ref >60)
GFR calc non Af Amer: >60 mL/min (ref >60)
GFR calc non Af Amer: >60 mL/min (ref >60)
Glucose, Bld: 93 mg/dL (ref 70–99)
Glucose, Bld: 97 mg/dL (ref 70–99)
Potassium: 4.2 mEq/L (ref 3.5–5.1)
Sodium: 136 mEq/L (ref 135–145)
Sodium: 137 mEq/L (ref 135–145)

## 2010-11-08 LAB — PROTIME-INR
INR: 1.1 (ref 0.00–1.49)
INR: 1.2 (ref 0.00–1.49)
Prothrombin Time: 15 seconds (ref 11.6–15.2)
Prothrombin Time: 17.5 seconds — ABNORMAL HIGH (ref 11.6–15.2)

## 2010-11-09 LAB — POCT I-STAT 4, (NA,K, GLUC, HGB,HCT)
Glucose, Bld: 113 mg/dL — ABNORMAL HIGH (ref 70–99)
Glucose, Bld: 128 mg/dL — ABNORMAL HIGH (ref 70–99)
Glucose, Bld: 136 mg/dL — ABNORMAL HIGH (ref 70–99)
Glucose, Bld: 79 mg/dL (ref 70–99)
Glucose, Bld: 88 mg/dL (ref 70–99)
Glucose, Bld: 96 mg/dL (ref 70–99)
HCT: 21 % — ABNORMAL LOW (ref 36.0–46.0)
HCT: 21 % — ABNORMAL LOW (ref 36.0–46.0)
HCT: 21 % — ABNORMAL LOW (ref 36.0–46.0)
HCT: 21 % — ABNORMAL LOW (ref 36.0–46.0)
HCT: 22 % — ABNORMAL LOW (ref 36.0–46.0)
HCT: 23 % — ABNORMAL LOW (ref 36.0–46.0)
HCT: 29 % — ABNORMAL LOW (ref 36.0–46.0)
HCT: 31 % — ABNORMAL LOW (ref 36.0–46.0)
Hemoglobin: 10.5 g/dL — ABNORMAL LOW (ref 12.0–15.0)
Hemoglobin: 7.1 g/dL — CL (ref 12.0–15.0)
Hemoglobin: 7.1 g/dL — CL (ref 12.0–15.0)
Hemoglobin: 7.5 g/dL — CL (ref 12.0–15.0)
Hemoglobin: 7.8 g/dL — CL (ref 12.0–15.0)
Hemoglobin: 9.9 g/dL — ABNORMAL LOW (ref 12.0–15.0)
Potassium: 3.3 mEq/L — ABNORMAL LOW (ref 3.5–5.1)
Potassium: 3.6 mEq/L (ref 3.5–5.1)
Potassium: 3.7 mEq/L (ref 3.5–5.1)
Potassium: 5 mEq/L (ref 3.5–5.1)
Potassium: 5.1 mEq/L (ref 3.5–5.1)
Potassium: 5.9 mEq/L — ABNORMAL HIGH (ref 3.5–5.1)
Sodium: 128 mEq/L — ABNORMAL LOW (ref 135–145)
Sodium: 133 mEq/L — ABNORMAL LOW (ref 135–145)
Sodium: 134 mEq/L — ABNORMAL LOW (ref 135–145)
Sodium: 134 mEq/L — ABNORMAL LOW (ref 135–145)
Sodium: 139 mEq/L (ref 135–145)
Sodium: 139 mEq/L (ref 135–145)
Sodium: 143 mEq/L (ref 135–145)

## 2010-11-09 LAB — CBC
HCT: 26.1 % — ABNORMAL LOW (ref 36.0–46.0)
HCT: 26.2 % — ABNORMAL LOW (ref 36.0–46.0)
HCT: 29.3 % — ABNORMAL LOW (ref 36.0–46.0)
HCT: 38.9 % (ref 36.0–46.0)
Hemoglobin: 10.1 g/dL — ABNORMAL LOW (ref 12.0–15.0)
Hemoglobin: 9 g/dL — ABNORMAL LOW (ref 12.0–15.0)
MCHC: 34 g/dL (ref 30.0–36.0)
MCHC: 34.1 g/dL (ref 30.0–36.0)
MCHC: 34.6 g/dL (ref 30.0–36.0)
MCHC: 35.3 g/dL (ref 30.0–36.0)
MCV: 90.3 fL (ref 78.0–100.0)
MCV: 90.4 fL (ref 78.0–100.0)
MCV: 90.6 fL (ref 78.0–100.0)
Platelets: 113 10*3/uL — ABNORMAL LOW (ref 150–400)
Platelets: 121 10*3/uL — ABNORMAL LOW (ref 150–400)
Platelets: 138 10*3/uL — ABNORMAL LOW (ref 150–400)
Platelets: 139 10*3/uL — ABNORMAL LOW (ref 150–400)
Platelets: 205 10*3/uL (ref 150–400)
RBC: 3.05 MIL/uL — ABNORMAL LOW (ref 3.87–5.11)
RDW: 12.4 % (ref 11.5–15.5)
RDW: 12.4 % (ref 11.5–15.5)
RDW: 12.7 % (ref 11.5–15.5)
RDW: 13.6 % (ref 11.5–15.5)
WBC: 10.6 10*3/uL — ABNORMAL HIGH (ref 4.0–10.5)
WBC: 11.6 10*3/uL — ABNORMAL HIGH (ref 4.0–10.5)

## 2010-11-09 LAB — GLUCOSE, CAPILLARY
Glucose-Capillary: 104 mg/dL — ABNORMAL HIGH (ref 70–99)
Glucose-Capillary: 122 mg/dL — ABNORMAL HIGH (ref 70–99)
Glucose-Capillary: 134 mg/dL — ABNORMAL HIGH (ref 70–99)
Glucose-Capillary: 135 mg/dL — ABNORMAL HIGH (ref 70–99)
Glucose-Capillary: 151 mg/dL — ABNORMAL HIGH (ref 70–99)
Glucose-Capillary: 87 mg/dL (ref 70–99)
Glucose-Capillary: 90 mg/dL (ref 70–99)
Glucose-Capillary: 98 mg/dL (ref 70–99)

## 2010-11-09 LAB — POCT I-STAT 3, ART BLOOD GAS (G3+)
Acid-base deficit: 1 mmol/L (ref 0.0–2.0)
Acid-base deficit: 2 mmol/L (ref 0.0–2.0)
Acid-base deficit: 2 mmol/L (ref 0.0–2.0)
Acid-base deficit: 5 mmol/L — ABNORMAL HIGH (ref 0.0–2.0)
Bicarbonate: 23.5 mEq/L (ref 20.0–24.0)
Bicarbonate: 24 mEq/L (ref 20.0–24.0)
Bicarbonate: 25.2 mEq/L — ABNORMAL HIGH (ref 20.0–24.0)
O2 Saturation: 100 %
O2 Saturation: 100 %
O2 Saturation: 100 %
O2 Saturation: 96 %
TCO2: 24 mmol/L (ref 0–100)
TCO2: 27 mmol/L (ref 0–100)
pCO2 arterial: 38.7 mmHg (ref 35.0–45.0)
pCO2 arterial: 43.5 mmHg (ref 35.0–45.0)
pCO2 arterial: 48.8 mmHg — ABNORMAL HIGH (ref 35.0–45.0)
pH, Arterial: 7.337 — ABNORMAL LOW (ref 7.350–7.400)
pH, Arterial: 7.392 (ref 7.350–7.400)
pH, Arterial: 7.394 (ref 7.350–7.400)
pO2, Arterial: 269 mmHg — ABNORMAL HIGH (ref 80.0–100.0)
pO2, Arterial: 291 mmHg — ABNORMAL HIGH (ref 80.0–100.0)
pO2, Arterial: 311 mmHg — ABNORMAL HIGH (ref 80.0–100.0)
pO2, Arterial: 83 mmHg (ref 80.0–100.0)
pO2, Arterial: 85 mmHg (ref 80.0–100.0)

## 2010-11-09 LAB — POCT I-STAT, CHEM 8
BUN: 10 mg/dL (ref 6–23)
Calcium, Ion: 1.04 mmol/L — ABNORMAL LOW (ref 1.12–1.32)
Calcium, Ion: 1.15 mmol/L (ref 1.12–1.32)
Chloride: 102 mEq/L (ref 96–112)
Chloride: 102 mEq/L (ref 96–112)
Chloride: 108 mEq/L (ref 96–112)
Creatinine, Ser: 0.8 mg/dL (ref 0.4–1.2)
Glucose, Bld: 115 mg/dL — ABNORMAL HIGH (ref 70–99)
Glucose, Bld: 179 mg/dL — ABNORMAL HIGH (ref 70–99)
HCT: 26 % — ABNORMAL LOW (ref 36.0–46.0)
HCT: 28 % — ABNORMAL LOW (ref 36.0–46.0)
Hemoglobin: 8.8 g/dL — ABNORMAL LOW (ref 12.0–15.0)
Potassium: 4.1 mEq/L (ref 3.5–5.1)
TCO2: 22 mmol/L (ref 0–100)
TCO2: 23 mmol/L (ref 0–100)

## 2010-11-09 LAB — BASIC METABOLIC PANEL
BUN: 19 mg/dL (ref 6–23)
BUN: 26 mg/dL — ABNORMAL HIGH (ref 6–23)
BUN: 8 mg/dL (ref 6–23)
CO2: 23 mEq/L (ref 19–32)
Calcium: 7.6 mg/dL — ABNORMAL LOW (ref 8.4–10.5)
Chloride: 102 mEq/L (ref 96–112)
Creatinine, Ser: 1.41 mg/dL — ABNORMAL HIGH (ref 0.4–1.2)
Creatinine, Ser: 1.48 mg/dL — ABNORMAL HIGH (ref 0.4–1.2)
GFR calc Af Amer: 45 mL/min — ABNORMAL LOW (ref >60)
GFR calc non Af Amer: 35 mL/min — ABNORMAL LOW (ref >60)
GFR calc non Af Amer: >60 mL/min (ref >60)
Glucose, Bld: 109 mg/dL — ABNORMAL HIGH (ref 70–99)
Glucose, Bld: 134 mg/dL — ABNORMAL HIGH (ref 70–99)
Potassium: 3.7 mEq/L (ref 3.5–5.1)
Sodium: 137 mEq/L (ref 135–145)

## 2010-11-09 LAB — POCT I-STAT 3, VENOUS BLOOD GAS (G3P V)
Acid-base deficit: 1 mmol/L (ref 0.0–2.0)
Bicarbonate: 24.9 mEq/L — ABNORMAL HIGH (ref 20.0–24.0)
TCO2: 24 mmol/L (ref 0–100)
pCO2, Ven: 42.7 mmHg — ABNORMAL LOW (ref 45.0–50.0)
pH, Ven: 7.337 — ABNORMAL HIGH (ref 7.250–7.300)
pH, Ven: 7.337 — ABNORMAL HIGH (ref 7.250–7.300)
pO2, Ven: 109 mmHg — ABNORMAL HIGH (ref 30.0–45.0)

## 2010-11-09 LAB — PROTIME-INR
Prothrombin Time: 13.8 seconds (ref 11.6–15.2)
Prothrombin Time: 20.7 seconds — ABNORMAL HIGH (ref 11.6–15.2)

## 2010-11-09 LAB — MAGNESIUM: Magnesium: 2.7 mg/dL — ABNORMAL HIGH (ref 1.5–2.5)

## 2010-11-09 LAB — URINALYSIS, ROUTINE W REFLEX MICROSCOPIC
Bilirubin Urine: NEGATIVE
Hgb urine dipstick: NEGATIVE
Specific Gravity, Urine: 1.014 (ref 1.005–1.030)
pH: 6 (ref 5.0–8.0)

## 2010-11-09 LAB — TYPE AND SCREEN
ABO/RH(D): O POS
Antibody Screen: NEGATIVE

## 2010-11-09 LAB — CREATININE, SERUM
Creatinine, Ser: 1.16 mg/dL (ref 0.4–1.2)
GFR calc non Af Amer: 46 mL/min — ABNORMAL LOW (ref >60)

## 2010-11-09 LAB — BLOOD GAS, ARTERIAL
Patient temperature: 98.6
TCO2: 27.3 mmol/L (ref 0–100)
pH, Arterial: 7.443 — ABNORMAL HIGH (ref 7.350–7.400)

## 2010-11-09 LAB — COMPREHENSIVE METABOLIC PANEL
Albumin: 3.7 g/dL (ref 3.5–5.2)
BUN: 11 mg/dL (ref 6–23)
Calcium: 9.5 mg/dL (ref 8.4–10.5)
Creatinine, Ser: 0.97 mg/dL (ref 0.4–1.2)
Total Protein: 7 g/dL (ref 6.0–8.3)

## 2010-11-09 LAB — HEMOGLOBIN A1C: Mean Plasma Glucose: 100 mg/dL

## 2010-11-09 LAB — ABO/RH: ABO/RH(D): O POS

## 2010-11-09 LAB — HEMOGLOBIN AND HEMATOCRIT, BLOOD: HCT: 23.7 % — ABNORMAL LOW (ref 36.0–46.0)

## 2010-11-09 LAB — APTT: aPTT: 31 seconds (ref 24–37)

## 2010-12-16 NOTE — Op Note (Signed)
NAMESABRIE, MORITZ NO.:  [id number]   MEDICAL RECORD NO.:  [id number]          PATIENT TYPE:  INP   LOCATION:  2302                         FACILITY:  [facility]   PHYSICIAN:  Salvatore Decent. Cornelius Moras, M.D. DATE OF BIRTH:  [birth year]   DATE OF PROCEDURE:  02/28/2009  DATE OF DISCHARGE:                               OPERATIVE REPORT   PREOPERATIVE DIAGNOSES:  1. 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  2. Paroxysmal atrial fibrillation.   POSTOPERATIVE DIAGNOSES:  1. 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  2. Paroxysmal atrial fibrillation.   PROCEDURE:  Right miniature thoracotomy for mitral valve repair  (quadrangular resection of posterior leaflet with leaflet plication and  30-mm Sorin MEMO 3D ring annuloplasty) and Cox cryo maze procedure (left  side lesion set).   SURGEON:  Salvatore Decent. Cornelius Moras, MD   ASSISTANT:  Doree Fudge, PA   ANESTHESIA:  General.   BRIEF CLINICAL NOTE:  The patient is a 70 year old female from [facility] with longstanding history of mitral valve prolapse and mitral  regurgitation.  This was first discovered 30 years ago.  Recently, the  patient has been followed by Dr. Merrily Pew at [facility] in [facility].  The patient has suffered from progressive  exertional shortness of breath.  On January 27, 2009, the patient was  hospitalized with rapid atrial fibrillation.  She converted back to  normal sinus rhythm on medical therapy.  Transesophageal echocardiogram  was performed demonstrating mitral valve prolapse with a flail segment  of the posterior leaflet of the mitral valve and severe (4+) mitral  regurgitation.  Left ventricular systolic function remained normal.  Left and right heart catheterizations demonstrate moderate pulmonary  hypertension with no significant coronary artery disease.  The patient  was referred for possible elective surgical intervention and a full  consultation note has been dictated previously.  The patient and her  family provided full informed consent for the surgery as described.   OPERATIVE FINDINGS:  1. 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  2. Flail segment of posterior leaflet (P1/P2) with multiple ruptured      primary chords.  3. Severe (4+) mitral regurgitation.  4. Normal left ventricular systolic function.  5. Moderate pulmonary hypertension.  6. No residual mitral regurgitation following successful mitral valve      repair.   OPERATIVE NOTE IN DETAIL:  The patient was brought to the operating room  on the above-mentioned date and central monitoring was established by  the Anesthesia Team under the care and direction of Dr. Krista Blue.  Specifically, a Swan-Ganz catheter was placed through the left internal  jugular approach.  A radial arterial line was placed.  Intravenous  antibiotics were administered.  The patient was placed in the supine  position on the operating table.  General endotracheal anesthesia was  induced uneventfully.  The patient was initially intubated with a dual-  lumen endotracheal tube.  A Foley catheter was placed.  The patient was  positioned with a soft roll behind the right scapula and the neck gently  extended and turned towards the  left.  The patient's right neck, chest,  abdomen, both groins, and both lower extremities were prepared and  draped in sterile manner.   Baseline transesophageal echocardiogram was performed by Dr. Krista Blue.  This confirmed the presence of mitral valve prolapse with a flail  segment of the portion of the posterior leaflet and severe (4+) mitral  regurgitation.  There is left atrial enlargement.  There is normal left  ventricular systolic function.  There is mild aortic regurgitation.  There is mild tricuspid regurgitation.  No other abnormalities are  noted.   A small incision was made in the right inguinal crease.  The anterior  surface of  the right common femoral artery and right common femoral vein  were dissected through this incision.  The femoral artery is normal in  appearance.   Single lung ventilation was begun.  A right miniature anterolateral  thoracotomy incision was performed.  The right pleural space was entered  through the third intercostal space.  Two 11-mm thoracoscopic ports were  placed through the inframammary crease and the right chest is  insufflated continuously with carbon dioxide gas through one of the  ports throughout the remainder of the operation.  Pledgeted sutures were  placed in the dome of the right hemidiaphragm and retracted inferior to  facilitate exposure.  The pericardium was opened longitudinally 3 cm  anterior to the phrenic nerve.  Silk traction sutures were placed in  either direction for exposure.   The patient was heparinized systemically.  A pursestring suture was  placed on the anterior surface of the right common femoral vein.  Two  concentric pursestring sutures were placed on the anterior surface of  the right common femoral artery.  The right common femoral vein was  cannulated with a Seldinger technique and a long flexible guidewire was  advanced up through the inferior vena cava through the right atrium into  the superior vena cava using transesophageal echocardiogram for  guidance.  The femoral vein was then dilated with serial dilators and a  22-French long femoral venous cannula was advanced over the guidewire up  through the inferior vena cava through the right atrium until the tip of  the cannula extended into the superior vena cava.  The right common  femoral artery was now cannulated with a Seldinger technique and a long  flexible guidewire was advanced into the descending thoracic aorta.  The  guidewire position within the lumen was verified with transesophageal  echocardiogram.  The femoral artery was dilated with serial dilators and  a 18-French femoral  arterial cannula was advanced uneventfully into the  iliac artery.  A small stab incision was made low in the right neck.  The right internal jugular vein was cannulated with Seldinger technique  and a long flexible guidewire was advanced into the right atrium.  The  internal jugular vein was dilated with serial dilators and a 14-French  pediatric femoral venous cannula was advanced over the guidewire into  the superior vena cava.   Cardiopulmonary bypass was begun.  Vacuum-assist venous drainage was  utilized.  Venous drainage and subsequent exposure are notably  excellent.  The pericardiotomy incision was extended in both directions.  The undersurface of the aorta was gently dissected away from the  anterior surface of the right pulmonary artery.  A pursestring suture  was placed in the right atrium and a retrograde cardioplegic cannula was  placed through the right atrium into the coronary sinus using  transesophageal echocardiogram for guidance.  Two concentric pursestring  sutures were placed on the proximal anterolateral surface of the  ascending thoracic aorta and antegrade cardioplegic cannula was placed  into the aorta.  The patient was cooled to 28 degrees systemic  temperature.  The aortic cross-clamp was applied and cold blood  cardioplegia was delivered initially in an antegrade fashion through the  aortic root.  The initial cardioplegic arrest was rapid with early  diastolic arrest.  Supplemental cardioplegia was administered retrograde  through the coronary sinus catheter.  Repeat doses of cardioplegia were  administered every 20 minutes throughout the entire cross-clamp portion  of the operation to maintain completely flat electrocardiogram, both  antegrade and retrograde.  Myocardial protection was felt to be  excellent.   The left atriotomy incision was performed posteriorly through the  interatrial groove and extended partway across the back wall of the left  atrium  after opening the oblique sinus.  The floor of the left atrium  and the mitral valve are exposed using retractor blade attached to a  side arm placed through a small stab incision just to the right side of  the sternum through the third intercostal space.  Exposure is felt to be  excellent.  The mitral valve is carefully examined.  There was Filbert Schilder-  type myxomatous degeneration of the mitral valve with a large valve and  redundant thickened leaflet tissue.  There was severe prolapse with a  flail segment of the posterior leaflet comprising the P1 and P2 region.  The flail segment incompetence is approximately two-thirds of P1 segment  and a small portion of P2.  There is also mild prolapse, although no  flail ruptured chords in the P2 and P3 region.  There is no annular  calcification and the remainder of the subvalvular apparatus appeared  normal.   Left side lesion set of the Cox cryo maze procedure is now performed  using ATS cryoprobe.  Each lesion is performed using a 2-minute freeze.  Initially, a lesion is placed from the inferior apex of the atriotomy  incision across the back wall of left atrium onto the coronary sinus  posteriorly along the epicardial surface.  A mirror image lesion is now  placed along the endocardial surface of left atrium coursing onto the  posterior mitral annulus.  A lesion is then placed from the cephalad  apex of the atriotomy incision across the dome of left atrium to reach  the anterior rim of the left-sided pulmonary vein.  A mirror image  lesion was placed across the back wall of the left atrium to reach the  inferior rim of the left-sided pulmonary veins.  This completes the left  side lesion set of the Cox cryo maze procedure.   The left atrial appendage was oversewn from within the left atrium with  a 2-layer closure of running 3-0 Prolene suture.   Interrupted 2-0 Ethibond horizontal mattress sutures were now placed  circumferentially around  the entire mitral annulus.  These sutures will  ultimately be utilized for ring annuloplasty, and at this juncture they  are suspended to lift the valve symmetrically to facilitate repair.  The  flail segment of P1 and P2 is now addressed.  This flail segment was  resected with a quadrangular resection.  A single figure-of-eight 2-0  Ethibond compression suture was placed in the posterior annulus at the  base of the area of resection.  The intervening vertical defect of the  leaflet was now closed with interrupted simple everting CV5  Gore-Tex  sutures.  At this juncture, the valve was tested and appears to be  competent without any further intervention.  The valve was sized to  accept a 30-mm annuloplasty ring which is slightly larger than the  dimensions of the anterior leaflet.  The ring is slightly upsized due to  the redundant nature of leaflet tissue in all areas.  A Sorin MEMO 3D  ring annuloplasty (catalog number [id number], serial [id number]) is now secured  in place uneventfully.  The valve was again tested with saline and  appears to be perfectly competent.  There is some asymmetrical closure  between A3 and P3 due to mild prolapse of P3.  This is now corrected  with plication of the cleft between P2 and P3 with interrupted CV5 Gore-  Tex sutures.  The valve was again tested with saline and remain  perfectly competent.  The line of coaptation is now symmetrical with  broad surface area of coaptation.  There is no sign of any significant  regurgitation.  Rewarming was begun.   A left ventricular vent drain was placed across the mitral valve and the  left atriotomy closure was completed with a 2-layer closure of running 3-  0 Prolene suture.  One final dose of warm hot shot cardioplegia was  administered and the lungs were ventilated to evacuate any residual air.  The aortic cross-clamp was removed after total cross-clamp time of 135  minutes.  Epicardial pacing wires were fixed to the  undersurface of the  right ventricular free wall.  Epicardial pacing wires were fixed to the  right atrial appendage.  The patient is rewarmed to 37 degrees  centigrade temperature.  The lungs were ventilated and heart allowed to  fill briefly after which time the left ventricular vent was removed.  The heart was again allowed to fill and the mitral valve repair was  briefly inspected and appears to be perfectly competent.  The antegrade  cardioplegic cannula was then removed.   The patient is weaned from cardiopulmonary bypass without difficulty.  The patient's rhythm at separation from bypass is AV sequential paced.  Total cardiopulmonary bypass time of the operation is 195 minutes.  The  patient is weaned from bypass on low-dose dopamine and milrinone  infusions.  Followup transesophageal echocardiogram performed by Dr.  Krista Blue after separation from bypass demonstrates a well-seated  annuloplasty ring in the mitral position.  The mitral valve is  functioning normally.  There is trivial residual mitral regurgitation.  There remains mild aortic regurgitation.  Left ventricular function  remains normal.  No other abnormalities are noted.   Protamine was administered to reverse the anticoagulation.  The venous  and femoral arterial cannulae were both removed and the pursestring  sutures were secured.  There was a palpable pulse in the distal right  femoral artery.  The right internal jugular cannula was removed and this  site was controlled with large pledgeted suture and manual pressure.   Single lung ventilation was begun.  The atriotomy closure was inspected  for hemostasis.  The aortic cardioplegic cannulation site was inspected  for hemostasis.  Pericardial space was drained with a 28-French Bard  drain exited through the anterior port incision.  The pericardium was  now closed with a patch of core matrix, bovine intestine, submucosal  tissue matrix framework after soaking the  patch in saline for 10 minutes  per manufacturer recommendations.   The right groin incision was closed in multiple layers and the skin  incision closed with subcuticular skin closure.  The right pleural space  was drained with a 28-French Bard drain exited through the posterior  port incision.  The On-Q continuous pain management system was utilized to facilitate postoperative pain control.  One 5-inch catheter supplied  with On-Q kit tunneled initially through the subcutaneous tissues to the  posterior port incision and then tunneled into the subpleural space  posteriorly to cover the second through the sixth intercostal nerve  roots.  The catheter was flushed with 5 mL of 0.5% bupivacaine solution  and ultimately connected to continuous infusion pump.  The miniature  thoracotomy incision was closed in multiple layers and skin incision  closed with subcuticular skin closure.  The chest tubes were fixed to  closed suction drainage device.   The patient tolerated the procedure well.  The patient is reintubated  with a single-lumen endotracheal tube and subsequently transported to  the Surgical Intensive Care Unit in stable condition.  There are no  intraoperative complications.  All sponge, instrument, and needle counts  were verified correct at the completion of the operation.      Salvatore Decent. Cornelius Moras, M.D.  Electronically Signed     CHO/MEDQ  D:  02/28/2009  T:  03/01/2009  Job:  [id number]   cc:   Merrily Pew, MD  Synetta Fail, MD

## 2010-12-16 NOTE — Assessment & Plan Note (Signed)
OFFICE VISIT   MARLAYSIA, LENIG  DOB:  08/06/1940                                        March 18, 2009  CHART #:  [id number]   The patient returns to the office today for routine followup, status  post right miniature thoracotomy for mitral valve repair and maze  procedure on February 28, 2009.  Her early postoperative recovery was  notable for the presence of a systolic murmur heard on routine physical  exam in the morning after surgery.  A followup echocardiogram performed  that day demonstrated systolic anterior motion of the mitral valve with  dynamic left ventricular outflow tract obstruction.  The mitral valve  repair itself remained intact and the left ventricle was hyperdynamic.  At that time, she was on low-dose inotropic agents.  These were  discontinued and the beta-blocker was stopped.  Her murmur disappeared  and a followup echocardiogram demonstrated markedly diminished left  ventricular outflow tract gradient prior to hospital discharge.  She has  otherwise done quite well.  Following hospital discharge, she has been  participating in physical therapy with home health physical therapist.  Home health nursing has stopped by to check her prothrombin time, and  this has been monitored with Coumadin dose adjustment through Dr.  Valli Glance office at [facility] in [facility].  Overall, the  patient reports that she has done quite well.  She has not yet seen Dr.  Bary Castilla in followup, but she has an appointment scheduled to see him  within the next month.  She denies any shortness of breath.  She still  gets a little bit short of breath with activity and she will have a  tendency to develop a dry cough if she talks a lot.  Otherwise, she  feels much better than she did prior to surgery in terms of her  breathing and she denies any of the preexisting symptoms of orthopnea  and resting shortness of breath.  She has had minimal soreness in her  chest and she is not taking any pain medicine.  She has not had any  tachypalpitations.  She has one very brief transient episode of  dizziness when she stood up suddenly last week from a lying position.  These symptoms resolved within seconds and she has otherwise not had any  dizzy spells.  Her appetite is good.  Bowel function is normal.  She has  no other complaints.  Medications remain unchanged from the time of  hospital discharge other than the fact that her Coumadin dose has been  adjusted.   PHYSICAL EXAMINATION:  GENERAL:  A well-appearing female.  VITAL SIGNS:  Blood pressure 126/75, pulse 68 and regular.  Two-channel  telemetry rhythm strip demonstrates normal sinus rhythm.  Oxygen  saturation is 98% on room air.  CHEST:  Minithoracotomy incision that is healing nicely.  The chest tube  incisions are also healing well.  LUNGS:  Auscultation of the chest demonstrates clear breath sounds which  are symmetrical.  No wheezes or rhonchi are noted.  CARDIOVASCULAR:  Regular rate and rhythm.  No murmurs, rubs, or gallops  are noted at all at this time.  The previously present murmur along the  left ventricular outflow tract has resolved entirely on physical exam.  ABDOMEN:  Soft and nontender.  The right groin incision is healing  nicely.  EXTREMITIES:  There is no lower extremity edema.  Pulses are palpable.   DIAGNOSTIC TEST:  Chest x-ray performed today at the [facility] is reviewed.  This demonstrates clear lung fields bilaterally  with trivial pleural effusions.  No other significant abnormalities are  noted.   IMPRESSION:  The patient appears to be doing quite well, now  approximately 1 month status post right miniature thoracotomy for mitral  valve repair and maze procedure.  She is maintaining sinus rhythm on  amiodarone and Coumadin.  Her exercise tolerance is improving and she  has essentially no heart failure symptoms at this time.   PLAN:  I have  suggested to the patient that she should start pushing  herself a bit more physically.  I suspect that she should be able to be  transitioned over from home health physical therapy to the outpatient  cardiac rehab program through [facility] at [facility] in the very near future.  Once this has occurred, I think  she could probably start driving an automobile.  I think that she should  start driving automobile short distances during daylight hours initially  and gradually increase from there.  She has asked about going back to  work.  Until she starts pushing herself a bit more physically, I am  reluctant to give her the go ahead to go back to work until we see what  her exercise tolerance is.  It may take her sometime to gradually  improve her exercise tolerance and she may still have a tendency to get  short of breath, cough, or simply get very tired with physical activity.  All of her questions have been addressed.  Ultimately, I would favor  weaning her off of the amiodarone within the next 6-8 weeks.  If her  rhythm remains stable off amiodarone, her Coumadin could be stopped 3  months out following surgery.  All of her questions have been addressed.   Salvatore Decent. Cornelius Moras, M.D.  Electronically Signed   CHO/MEDQ  D:  03/18/2009  T:  03/19/2009  Job:  [id number]   cc:   Merrily Pew, MD  Synetta Fail, MD

## 2010-12-16 NOTE — Assessment & Plan Note (Signed)
OFFICE VISIT   ONETTA, SPAINHOWER  DOB:  [birth year]                                        May 13, 2009  CHART #:  [id number]   HISTORY OF PRESENT ILLNESS:  The patient returns to the office today for  routine followup status post right miniature thoracotomy for mitral  valve repair and Maze procedure on February 28, 2009.  She was last seen  here in the office on March 18, 2009.  Since then, she has continued to  do quite well.  She has continued to follow up carefully with Dr. Bary Castilla  in [facility], and over the last several weeks, she has been actively  participating in the [facility] through [facility].  She enjoys this and has been doing very  well.  She has also returned to work.  She denies any shortness of  breath and she now admits that she feels much better than she did prior  to her surgery.  She still gets tired by the end of the day.  Her  biggest problem is she still has trouble in the evening's resting,  particularly given that she has some chronic stresses at home that tend  to limit her progress.  However, she knows that she is doing much better  than she had been doing for months leading up until her surgery, and  overall she has no complaints.  She has no shortness of breath.  She has  no tachy palpitations or dizzy spells.  She has had no pain in her  chest.  The remainder of her review of systems is entirely unremarkable.  The remainder of her past medical history is unchanged.   PHYSICAL EXAMINATION:  General:  Well-appearing female.  Vital Signs:  Blood pressure 144/84, pulse 61 and regular.  Two channel telemetry  rhythm strip demonstrates normal sinus rhythm.  Oxygen saturations 97%  on room air.  Chest:  Well-healed miniature thoracotomy incision.  Breath sounds are clear to auscultation.  There is slightly diminished  breath sounds at the right lung base, but overall breath sounds  are  clear.  Cardiovascular:  Notable for a regular rate and rhythm.  No  murmurs, rubs, or gallops are noted.  Abdomen:  Soft, nontender.  Extremities:  Warm and well perfused.  There is no lower extremity  edema.  The remainder of her physical exam is unrevealing.   IMPRESSION:  The patient is doing quite well following mitral valve  repair and Maze procedure.  She appears to be maintaining sinus rhythm  and is free of symptoms of heart failure.  Her exercise tolerance is  slowly improving.   PLAN:  I have encouraged the patient to continue to gradually increase  her physical activity as tolerated.  I have suggested that she cut her  dose of amiodarone back to 100 mg daily, and this could probably be  discontinued at the time she returns to see Dr. Bary Castilla for her followup  if her rhythm remains stable.  If her rhythm remains stable off  amiodarone, I think it would also be reasonable to consider stopping  Coumadin in the coming months.  We have not made any other changes to  her current medications.  All of her questions have been addressed.  We  will  plan to see her back in 3 months for further followup.   Salvatore Decent. Cornelius Moras, M.D.  Electronically Signed   CHO/MEDQ  D:  05/13/2009  T:  05/14/2009  Job:  [id number]   cc:   Assunta Found, MD  Synetta Fail, MD

## 2010-12-16 NOTE — Assessment & Plan Note (Signed)
OFFICE VISIT   Mercedes, Reid  DOB:  [birth year]                                        February 25, 2009  CHART #:  [id number]   The patient returns for further followup with tentative plans to proceed  with surgery on Thursday, July 29.  She was originally seen in  consultation on July 14, and a full history and physical exam in  consultation report were dictated at that time.  Since then, she  underwent CT angiogram of the thoracic and abdominal aorta.  This exam  was normal and notable with clear evidence of any high-grade stenosis in  the descending thoracic aorta or iliac vessels.  The patient reports  that overall, she has no significant changes or problems.  She still has  fairly significant exertional shortness of breath.  She still has  occasional tachy palpitations and mild dizzy spells.  Otherwise, she has  had no new problems and she is eager to proceed with surgery as  described.   I spent in excess of 30 minutes with the patient and her family again  reviewing the indications, risks, and potential benefits of surgery.  Alternative treatment strategies have been discussed.  They understand  and accept all potential associated risks and desire to proceed with  surgery as described.  The patient also specifically requests that in  the event that her valve cannot be repairable that we would replace her  valve using a bioprosthetic tissue valve.  She understands that this  would come with the potential for a late structural valve deterioration  and failure.  Again, this will be highly unlikely as we fully expect to  repair her valve successfully on Thursday.  All of her questions have  been addressed.   Salvatore Decent. Cornelius Moras, M.D.  Electronically Signed   CHO/MEDQ  D:  02/25/2009  T:  02/26/2009  Job:  [id number]   cc:   Karolee Ohs, MD  Malka So MD

## 2010-12-16 NOTE — Assessment & Plan Note (Signed)
OFFICE VISIT   Mercedes Reid, Mercedes Reid  DOB:  29-Jul-1941                                        April 28, 2010  CHART #:  [id number]   HISTORY OF PRESENT ILLNESS:  The patient returns for followup now more  than 1 year status post right mini thoracotomy for mitral valve repair  and maze procedure on February 28, 2009.  She was last seen here in the  office on September 30, 2009.  Since then, she has continued to do well  from a cardiac standpoint.  She states that several months ago, she had  some problems with shortness of breath and underwent a full workup in  [facility].  She does not have the details of that evaluation, but she  was told that her heart function looked excellent and that no specific  problems were identified.  This was ultimately attributed to problems  with anxiety and stress related to her problems at home.  She has  continued to do well overall.  She still struggles with social issues,  stemming from caring for her husband, who requires round-the-clock care.  She reports that from a cardiac standpoint, she is doing very well.  She  has no shortness of breath.  She has no chest pain.  She denies any  tachy palpitations or dizzy spells.  She is trying to exercise more in  an effort to lose weight, but she admits that she has not been very  successful with that.  The remainder of her review of systems is  unremarkable.  The remainder of her past medical history is unchanged.   CURRENT MEDICATIONS:  Synthroid, aspirin, metoprolol, estradiol.   PHYSICAL EXAMINATION:  Notable for well-appearing female with blood  pressure 156/82 and pulse 53.  Two-channel telemetry rhythm strip  demonstrates normal sinus rhythm.  Examination of the chest reveals  clear breath sounds that are symmetrical bilaterally with exception of  slightly diminished breath sounds at the right lung base compared to the  left.  No wheezes, rales, or rhonchi are noted.   Cardiovascular exam  demonstrates regular rate and rhythm.  No murmurs, rubs, or gallops are  appreciated.  The abdomen is soft and nontender.  The extremities are  warm and well perfused.  There is no lower extremity edema.   IMPRESSION:  The patient appears to be doing well more than 1 year  status post mitral valve repair and maze procedure.  She is maintaining  sinus rhythm and doing well overall.   PLAN:  We will have the patient return in 1 year's time for routine  followup and rhythm check.  We will try to obtain results of any  followup echocardiogram that would have been done by Dr. Bary Castilla and his  colleagues in [facility] for our files.   Salvatore Decent. Cornelius Moras, M.D.  Electronically Signed   CHO/MEDQ  D:  04/28/2010  T:  04/29/2010  Job:  [id number]

## 2010-12-16 NOTE — Assessment & Plan Note (Signed)
OFFICE VISIT   Mercedes Reid, Mercedes Reid  DOB:  July 13, 1941                                        September 30, 2009  CHART #:  [id number]   HISTORY OF PRESENT ILLNESS:  The patient returns for followup status  post mitral valve repair and Cox CryoMaze procedure on February 28, 2009.  She has done quite well postoperatively and was last seen here in our  office on May 13, 2009.  Since then, she has continued to follow up  with Dr. Bary Castilla in [facility], and she has recently been taken off both  amiodarone and Coumadin.  She has been maintaining sinus rhythm.  The  patient continues to do quite well.  She states that she quit going to  the cardiac rehab program, and since then she has gained some weight.  She admits that she has not been exercising as regularly as she had been  before.  She otherwise feels fine.  She has no shortness of breath.  She  has no chest pain.  She has not had any tachy palpitations or dizzy  spells.  She has not had any problems with orthopnea or lower extremity  edema.  The remainder of her review of systems is unremarkable.  She is  not sure if she has had a followup echocardiogram performed yet.   CURRENT MEDICATIONS:  Aspirin, Synthroid, metoprolol, estradiol.   PHYSICAL EXAMINATION:  Notable for well-appearing female with blood  pressure 149/85, pulse 70 regular, oxygen saturation 97% on room air.  Examination of the chest reveals clear breath sounds which are  symmetrical bilaterally.  No wheezes or rhonchi are noted.  Cardiovascular exam is notable for regular rate and rhythm.  No murmurs,  rubs, or gallops are appreciated.  The abdomen is soft, nontender.  The  extremities are warm and well perfused.  There is no lower extremity  edema.   IMPRESSION:  This patient is doing very well and is maintaining sinus  rhythm off amiodarone and Coumadin.  She has no heart failure symptoms.  She is not sure if she has had a followup  echocardiogram performed since  her surgery.   PLAN:  We will have the patient return for routine followup and rhythm  check in 6 months.  In the meantime, she will continue to follow up with  Dr. Bary Castilla and Dr. Derrell Lolling for long-term attention of her medical needs.   Salvatore Decent. Cornelius Moras, M.D.  Electronically Signed   CHO/MEDQ  D:  09/30/2009  T:  10/01/2009  Job:  [id number]   cc:   Karolee Ohs, MD  Malka So, MD

## 2010-12-16 NOTE — Discharge Summary (Signed)
Mercedes Reid, WINTERHALTER NO.:  [id number]   MEDICAL RECORD NO.:  [id number]          PATIENT TYPE:  INP   LOCATION:  2021                         FACILITY:  [facility]   PHYSICIAN:  Salvatore Decent. Cornelius Moras, M.D. DATE OF BIRTH:  10-27-1940   DATE OF ADMISSION:  02/28/2009  DATE OF DISCHARGE:                               DISCHARGE SUMMARY   FINAL DIAGNOSES:  1. 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  2. Paroxysmal atrial fibrillation.   IN-HOSPITAL DIAGNOSES:  1. Volume overload postoperatively.  2. Acute blood loss anemia postoperatively.  3. 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   SECONDARY DIAGNOSES:  1. History of congestive heart failure.  2. Hypertension.  3. Hypothyroidism.  4. Status post hysterectomy.   IN-HOSPITAL OPERATIONS AND PROCEDURES:  1. Right miniature thoracotomy for mitral valve repair with      quadrangular resection of posterior leaflet with leaflet plication      and 30 mm Sorin Memo 3-D ring annuloplasty.  2. Cox CryoMaze procedure left side lesion.   HISTORY AND PHYSICAL AND HOSPITAL COURSE:  The patient is a 70 year old  female from [facility] with longstanding history of mitral valve  prolapse and mitral regurgitation.  This was first discovered about 30  years ago.  Recently, the patient has been followed by Dr. Merrily Pew  at [facility], [facility] in [place].  The patient has  suffered from progressive exertional shortness of breath.  On January 27, 2009 the patient was hospitalized with rapid atrial fibrillation.  She  converted back to normal sinus rhythm on medical therapy.  Transesophageal echocardiogram done demonstrated mitral valve prolapse  with a flail segment of posterior leaflet of mitral valve and severe 4+  mitral regurgitation.  Left ventricular systolic function remained  normal.  Cardiac catheterization  done and demonstrated moderate  pulmonary hypertension with no significant coronary artery disease.  The  patient was referred to Dr. Cornelius Moras.  Dr. Cornelius Moras saw and evaluated the  patient.  He discussed the patient regarding miniature thoracotomy with  mitral valve repair.  He discussed risks and benefits with the patient.  The patient nods her understanding and agreed to proceed.  Surgery was  scheduled for February 28, 2009.  For details of the patient's past medical  history and physical exam, please see dictated H&P.   The patient was taken to the operating room on February 28, 2009 where she  underwent right miniature thoracotomy for mitral valve repair with  quadrangular resection posterior leaflet with leaflet plication 30 mm,  Sorin Memo 3-D ring annuloplasty.  She also had Cox CryoMaze procedure  left-sided lesion.  The patient tolerated this procedure well and  transferred to the intensive care unit in stable condition.  Postoperatively, the patient was noted to be hemodynamically stable.  She was able to be extubated in the evening of surgery.  Post  extubation, the patient noted to be alert and oriented x4.  Neuro  intact.  Postoperatively on day 1 the patient was A  pacing.  Blood  pressure was stable low on multiple drips.  Fortunately, the patient  continued to have a systolic murmur on exam.  Two-D echocardiogram was  ordered.  This showed severe systolic anterior motion of the mitral  valve (SAM) with dynamic left ventricular outflow tract (LVOT)  obstruction but minimal mitral regurgitation.  The LVOT obstruction was  substantial with peak velocity in excess of 6 m/sec and a small,  hyperdynamic left ventricle.  The patient's external pacer was turned  off and all inotropic agents were weaned and discontinued.  Following  this, murmur was noted to be decreased.  A second repeat 2-D  echocardiogram was done on March 04, 2009 showing markedly diminished  dynamic LVOT obstruction and SAM  with peak velocity less than 3 m/sec.  There remained minimal residual mitral regurgitation and there was no  significant mitral stenosis.  The patient had been restarted on her  amiodarone.  External pacer remained off.  Low-dose beta blocker was  added prior to discharge.  Currently, she remained in normal sinus  rhythm.  Postoperatively chest x-rays obtained noted to be stable.  The  patient's chest tubes were discontinued in normal fashion.  She was  encouraged to use her incentive spirometer and was able to be weaned off  oxygen sating greater than 90% on room air.  The patient did have some  volume overload postoperatively.  She was started on Lasix drip.  Daily  weights were obtained and volume overload improving.  Lasix was  discontinued and she was started on p.o. Lasix.  Currently, the patient  remained 2 kg above her preoperative weight.  Postoperatively, the  patient did have some mild acute blood loss anemia.  Her hemoglobin and  hematocrit were followed closely.  They remained stable.  She did not  require transfusions postoperatively.  The patient was up ambulating  well with cardiac rehab.  She was tolerating diet well.  No nausea,  vomiting noted.  All incisions were clean, dry and intact and healing  well.  Case management has been consulted due to discharge planning  needs.  The patient's husband noted to have Lewy body disease.  It was  felt that the patient not be able to recover herself at home and to care  for her husband.  Question of staying with a friend versus skilled  nursing facility.  Currently, case management working on situation.   On March 05, 2009 postop day #5 the patient's vital signs noted to be  stable.  She is afebrile.  She is in normal sinus rhythm.  She is sating  greater than 90% on room air.  Her most recent lab work shows INR 1.2,  white blood cell count 7.5, hemoglobin 9.1, hematocrit 26.7, platelet  count 241.  Sodium 137, potassium 3.7,  chloride of 104, bicarbonate 28,  BUN 13, creatinine 0.8, glucose of 97.  The patient is tentatively ready  for discharge to home in the next 24-48 hours pending she remains stable  and arrangements made.   FOLLOWUP APPOINTMENTS:  Patient instructed to follow up with Dr. Cornelius Moras  March 18, 2009 at 2 o'clock p.m.  She is to obtain a PMI chest x-ray 30  minutes prior to this appointment.  She is to follow up with her nurse  for suture removal March 11, 2009 at 9:30 a.m.  The patient will need to  follow up with Dr. Bary Castilla in 2 weeks.  She will need to contact his  office  to make these arrangements.   ACTIVITY:  Patient instructed no driving until released to do so, no  lifting over 10 pounds.  She is told to ambulate 3-4 times per day  progress as tolerated and continue her breathing exercises.   INCISIONAL CARE:  The patient is told to shower washing her incisions  using soap water.  She is to contact the office if she develops any  drainage or opening from any of her incision sites.   DIET:  The patient educated on diet to be low-fat, low-salt.   DISCHARGE MEDICATIONS:  1. Synthroid 175 mcg daily.  2. Estradiol 1 mg daily.  3. Amiodarone 200 mg b.i.d.  4. Coumadin will be dosed based on the patient's discharge PT/INR      level.  5. Lasix 40 mg daily.  6. Potassium chloride 20 mEq daily.  7. Aspirin 81 mg daily.  8. Ultram 50 mg 1-2 tablets q. 4-6 h. p.r.n. pain.  9. Toprol XL 25 mg daily.      Sol Blazing, PA      Salvatore Decent. Cornelius Moras, M.D.  Electronically Signed    KMD/MEDQ  D:  03/05/2009  T:  03/06/2009  Job:  [id number]   cc:   Merrily Pew, MD

## 2010-12-16 NOTE — H&P (Signed)
HISTORY AND PHYSICAL EXAMINATION   February 13, 2009   Re:  Mercedes Reid, Mercedes Reid       DOB:  [birth year]   Date of planned hospital admission and surgery, February 28, 2009.   REASON FOR CONSULTATION:  Severe mitral regurgitation.   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  She has a longstanding  history of mitral valve prolapse and mitral regurgitation that was first  discovered in 1980.  At that time, she was hospitalized with transient  symptoms of congestive heart failure and treated medically.  She was  referred to physicians at [facility] in [place], and it was recommended that she have mitral valve replacement  performed.  She decided to stick with medical therapy at the time, and  she actually has done quite well until recently.  During recent years,  she has been followed by Dr. Bary Castilla at [facility] cardiology [facility] in  [facility].  She states that she was in her usual state of health and  has remained physically active and working full time until recently.  She has been under increased stress recently due to illness of her  husband.  Approximately 1 month ago, she first began to develop  progressive symptoms of exertional shortness of breath.  She noted that  she would get out of breath walking from her car into the court at work,  and this was distinctly different for her.  On January 27, 2009, she  developed relatively sudden onset of tachy palpitations, chest  discomfort, and dizziness for which she presented to the emergency room  at [facility].  There she was diagnosed with  rapid atrial fibrillation and was admitted to the hospital.  She  converted back to normal sinus rhythm spontaneously on medical therapy.  She ruled out for acute myocardial infarction.  She  underwent  transesophageal echocardiogram and left and right heart catheterization  on January 28, 2009.  Transesophageal echocardiogram revealed mitral valve  prolapse with flail posterior leaflet of mitral valve and severe mitral  regurgitation.  Left ventricular systolic function remained normal.  Left and right heart catheterization revealed normal coronary artery  anatomy with no significant coronary artery disease.  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  PA  pressures measured 58/14 with pulmonary capillary wedge pressure of 16  and large V-wave.  Cardiac output was 3.2 L per minute.  The patient did  well on medical therapy and was discharged home.  She has now been  referred for elective surgical intervention.   REVIEW OF SYSTEMS:  General:  The patient reports progressive fatigue  and some loss of appetite.  Vital Signs:  She is 5 feet 5 inches tall  and weighs 150 pounds and she states that she may have lost 5 or perhaps  10 pounds over the last few months.  Cardiac:  Notable for exertional  shortness of breath with occasional episodes of transient tachy  palpitations.  She denies any episodes of resting shortness of breath,  PND, orthopnea, or lower extremity edema.  She had the one transient  dizzy spell at the time of her presentation with rapid atrial  fibrillation.  She otherwise has had no syncopal episodes.  Respiratory:  Notable for intermittent dry cough that is exacerbated with  physical  activity.  She denies productive cough, hemoptysis, or wheezing.  Gastrointestinal:  Negative.  The patient has no difficulty swallowing.  She denies hematochezia, hematemesis, or melena.  Musculoskeletal:  Negative.  The patient denies arthritis or arthralgias.  She has no  difficulty walking and she has previously enjoyed fairly active physical  activity.  HEENT:  Negative.  The patient sees her dentist regularly and  gets routine dental  prophylaxis.  Psychiatric:  Negative, although the  patient has been under increased stress due to illness of her husband.   PAST MEDICAL HISTORY:  1. Mitral valve prolapse with mitral regurgitation.  2. Congestive heart failure.  3. Paroxysmal atrial fibrillation, recent onset.  4. Hypertension.  5. Hypothyroidism.   PAST SURGICAL HISTORY:  Hysterectomy.   SOCIAL HISTORY:  The patient is married and lives with her husband in  [place].  She has 2 grown children, one who lives locally and the  other who lives in [place].  She lives with her husband who is  somewhat debilitated and requires a lot of care at home.  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  She is a  nonsmoker and she denies alcohol consumption.   CURRENT MEDICATIONS:  1. Synthroid 175 mcg daily.  2. Digoxin 250 mcg daily.  3. Estradiol 1 mg tablet daily.  4. Dyazide HCT 37.5/25 one tablet daily.  5. Enteric-coated aspirin 325 mg daily.  6. Metoprolol 25 mg twice daily.  7. Cyclobenzaprine 5 mg every 8 hours as needed (rarely used).  8. Diltiazem XR 90 mg daily (stopped last week ago secondary to      exacerbation of cough).   DRUG ALLERGIES:  None known.   PHYSICAL EXAMINATION:  GENERAL:  The patient is a well-appearing female  who appears her stated age in no acute distress.  VITAL SIGNS:  Blood pressure 130/67, pulse 69 and regular, and oxygen  saturation 96% on room air.  HEENT:  Unrevealing.  NECK:  Supple.  There is no cervical or supraclavicular lymphadenopathy.  There is no jugular venous distention.  There are no carotid bruits.  CHEST:  Auscultation of the chest demonstrates clear breath sounds,  which are symmetrical bilaterally.  No wheezes, rales, or rhonchi are  noted.  CARDIOVASCULAR:  Notable for regular rate and rhythm.  There is a  prominent grade 4/6 holosystolic murmur heard all across the entire  precordium and chest with radiation to the axilla and  back.  No  diastolic murmurs are noted.  ABDOMEN:  Soft, nondistended, and nontender.  There is mild obesity.  There are no palpable masses.  EXTREMITIES:  Warm and well perfused.  There is trace bilateral lower  extremity edema.  Femoral pulses are somewhat diminished on the right,  but easily palpable on the left.  The patient did have heart  catheterization on the right.  Distal pulses are thready.  There is no  sign of obvious venous insufficiency.  RECTAL AND GU:  Both deferred.  NEUROLOGIC:  Grossly nonfocal and symmetrical throughout.   DIAGNOSTIC TEST:  Transesophageal echocardiogram performed on January 28, 2009 at [facility] is reviewed.  This  demonstrates mitral valve prolapse with flail middle scallop (P2) of the  posterior leaflet of mitral valve.  There is wide open, severe mitral  regurgitation.  The jet of regurgitation is directed eccentrically  around the anterior portion of the left atrium.  There are multiple  ruptured primary cords.  The middle scallop of the posterior leaflet is  also somewhat thickened, suggestive of longstanding mitral valve  prolapse.  There is no anterior leaflet prolapse.  The left atrium is  dilated.  Left ventricular systolic function appears normal.  The left  ventricle is mildly dilated.  The aortic valve is normal.  There is no  aortic insufficiency.  There is trace to mild tricuspid regurgitation.  No other abnormalities are noted.   Left and right heart catheterization performed on January 28, 2009 at [facility] is reviewed.  This demonstrates normal  coronary artery anatomy with left dominant coronary circulation.  There  is no significant coronary artery disease.  There is severe mitral  regurgitation.  Left ventricular systolic function appears normal.  No  other abnormalities are noted.  Pulmonary artery pressures are as noted  previously.   IMPRESSION:  Mitral valve prolapse with  flail posterior leaflet of the  mitral valve and severe (4+) mitral regurgitation.  The patient has  recent progressive symptoms of exertional shortness of breath as well as  recent onset paroxysmal atrial fibrillation.  She is currently  maintaining sinus rhythm.  I believe that she will be a good candidate  for mitral valve repair and concomitant maze procedure.  She will likely  be a good candidate for use of minimally invasive approach for surgery.   PLAN:  I have discussed options at length with the patient here in the  office today.  Her son is present and her daughter is lifting and over  the telephone.  We discussed long-term prognosis with continued medical  therapy in the rationale for proceeding with surgery sooner rather than  later.  We discussed surgical alternatives including both the  conventional sternotomy as well as the minimally invasive approach.  I  believe there is a high likelihood that her valve will be repairable.  She understands that there is a small chance that valve repair would not  be feasible and that valve replacement would be necessary.  Under those  circumstances, she would ask that we use bioprosthetic tissue valve to  replace her valve and hope that she will not need to be on longterm  anticoagulation with Coumadin.  We also discussed the rationale, risks  and benefits of concomitant maze procedure at the time of surgery in an  effort to decrease her likelihood of continued problems with atrial  fibrillation in the future.  All of her questions have been addressed.  We will obtain CT angiogram to evaluate possibility of significant  peripheral arterial disease.  Presuming that she does not have  significant arterial insufficiency involving in the descending thoracic  aorta or iliac vessels, we will plan to use minimally invasive approach.  The patient will return to see me here in the office on Monday, February 25, 2009 with tentative plans to proceed  with surgery on Thursday, February 28, 2009.  I have given her a prescription for amiodarone 200 mg p.o. twice  daily to begin now to decrease the likelihood of further problems with  atrial fibrillation between now and in the time of surgery and to  decrease the likelihood of postoperative atrial dysrhythmias.  All of  her questions have been addressed.   Salvatore Decent. Cornelius Moras, M.D.  Electronically Signed   CHO/MEDQ  D:  02/13/2009  T:  02/14/2009  Job:  [id number]   cc:   Merrily Pew, MD  Synetta Fail, MD

## 2011-03-01 HISTORY — PX: MITRAL VALVE REPAIR: SHX2039

## 2011-04-10 DIAGNOSIS — E079 Disorder of thyroid, unspecified: Secondary | ICD-10-CM | POA: Insufficient documentation

## 2011-04-10 DIAGNOSIS — I480 Paroxysmal atrial fibrillation: Secondary | ICD-10-CM

## 2011-04-10 DIAGNOSIS — Z8679 Personal history of other diseases of the circulatory system: Secondary | ICD-10-CM

## 2011-04-10 DIAGNOSIS — I10 Essential (primary) hypertension: Secondary | ICD-10-CM

## 2011-04-10 DIAGNOSIS — I340 Nonrheumatic mitral (valve) insufficiency: Secondary | ICD-10-CM

## 2011-04-10 DIAGNOSIS — I341 Nonrheumatic mitral (valve) prolapse: Secondary | ICD-10-CM

## 2011-04-13 ENCOUNTER — Encounter: Payer: Self-pay | Admitting: Thoracic Surgery (Cardiothoracic Vascular Surgery)

## 2011-04-13 ENCOUNTER — Ambulatory Visit (INDEPENDENT_AMBULATORY_CARE_PROVIDER_SITE_OTHER): Payer: Self-pay | Admitting: Thoracic Surgery (Cardiothoracic Vascular Surgery)

## 2011-04-13 VITALS — BP 124/70 | HR 78 | Resp 16 | Ht 65.0 in | Wt 176.0 lb

## 2011-04-13 DIAGNOSIS — I4891 Unspecified atrial fibrillation: Secondary | ICD-10-CM

## 2011-04-13 DIAGNOSIS — I059 Rheumatic mitral valve disease, unspecified: Secondary | ICD-10-CM

## 2011-04-13 NOTE — Progress Notes (Signed)
PCP is No primary provider on file. Referring Provider is Karolee Ohs., MD  Chief Complaint  Patient presents with  . Follow-up    1 yr s/p mvr coxmaze 02/28/09    HPI: Patient returns for routine followup now more than 2 years status post mitral valve repair and maze procedure. She was last seen here in the office 04/28/2010. Since then she has remained stable from a cardiovascular standpoint. However, she does complain of worsening symptoms of shortness of breath over the last 2 months. She was seen in followup by her cardiologist, and subsequently referred to a pulmonologist in [facility]. We do not have any results of that he reports diagnostic tests which might have been performed. The patient states that she doesn't think she's had an echocardiogram for the past year. She has had some atypical left-sided chest pain that does not seem to be related to physical activity. She has had some shortness of breath and has been more pronounced over the last couple of months. She has not had a productive cough. She has not had orthopnea PND, no lower extremity edema. She's not had any palpitations or dizzy spells.  Past Medical History  Diagnosis Date  . Hypertension   . Thyroid disease     HYPOTHYROIDISM  . H/O CHF   . Mitral valve prolapse   . Severe mitral regurgitation   . Paroxysmal atrial fibrillation     Past Surgical History  Procedure Date  . Abdominal hysterectomy   . Mitral valve repair 03/01/2011    Quadrangular resection of posterior leaflet with leafet plication and 30-mm SorinMEMO 3D ring annuloplasty)               . Cox-maze microwave ablation 02/28/2009    Dr. Barry Dienes left side lesion set.    No family history on file.  Social History History  Substance Use Topics  . Smoking status: Never Smoker   . Smokeless tobacco: Not on file  . Alcohol Use: No    Current Outpatient Prescriptions  Medication Sig Dispense Refill  . aspirin 81 MG tablet Take 81  mg by mouth daily.        Marland Kitchen estradiol (ESTRACE) 1 MG tablet Take 1 mg by mouth daily.        Marland Kitchen levothyroxine (SYNTHROID, LEVOTHROID) 175 MCG tablet Take 175 mcg by mouth daily.        . meloxicam (MOBIC) 7.5 MG tablet Take 7.5 mg by mouth daily.        . metoprolol tartrate (LOPRESSOR) 25 MG tablet Take 25 mg by mouth 2 (two) times daily.        . traMADol (ULTRAM) 50 MG tablet Take 50 mg by mouth every 6 (six) hours as needed.          No Known Allergies  Review of Systems: Patient has had worsening symptoms of shortness of breath. She denies orthopnea PND lower extremity edema. She has not had any exertional chest pain. She has had some atypical chest pain. She has not had palpitations or dizzy spells. She has not had cough. She has gained weight over the past year and she think she has gained at least 15 pounds.  BP 124/70  Pulse 78  Resp 16  Ht 5\' 5"  (1.651 m)  Wt 176 lb (79.833 kg)  BMI 29.29 kg/m2  SpO2 93% Physical Exam: On physical exam the patient looks good. There is no jugular venous distention. Auscultation of the chest reveals clear  breath sounds which are symmetrical. No wheezes rales or rhonchi are noted. Cardiovascular exam is notable for regular rate and rhythm. No murmurs rubs or gallops are noted. The abdomen is soft nontender. Extremities warm and well-perfused. There is no lower extremity edema.  Diagnostic Tests: There are no new diagnostic tests to reviewed. Patient presents been more than a year since her last echocardiogram.  Impression: Patient reports worsening symptoms of shortness of breath. From a cardiovascular standpoint she otherwise remains quite stable. She is now more than 2 years out following mitral valve repair and Maze procedure. She is maintaining sinus rhythm.  Plan: It would be reasonable to consider a followup echocardiogram. However, there are no murmurs on physical exam in the patient's symptoms are atypical and did not seem to be  cardiovascular in origin. We will defer any subsequent workup to her primary cardiologist, Dr. Merrily Pew.

## 2011-05-12 ENCOUNTER — Ambulatory Visit: Payer: BC Managed Care – PPO | Attending: Internal Medicine | Admitting: Physical Therapy

## 2011-05-12 DIAGNOSIS — M545 Low back pain, unspecified: Secondary | ICD-10-CM | POA: Insufficient documentation

## 2011-05-12 DIAGNOSIS — R293 Abnormal posture: Secondary | ICD-10-CM | POA: Insufficient documentation

## 2011-05-12 DIAGNOSIS — IMO0001 Reserved for inherently not codable concepts without codable children: Secondary | ICD-10-CM | POA: Insufficient documentation

## 2011-05-12 DIAGNOSIS — M6281 Muscle weakness (generalized): Secondary | ICD-10-CM | POA: Insufficient documentation

## 2011-05-13 ENCOUNTER — Ambulatory Visit: Payer: BC Managed Care – PPO | Admitting: Physical Therapy

## 2011-05-14 ENCOUNTER — Encounter: Payer: BC Managed Care – PPO | Admitting: Physical Therapy

## 2011-05-18 ENCOUNTER — Ambulatory Visit: Payer: BC Managed Care – PPO | Admitting: Physical Therapy

## 2011-05-19 ENCOUNTER — Ambulatory Visit: Payer: BC Managed Care – PPO | Admitting: Physical Therapy

## 2011-05-25 ENCOUNTER — Ambulatory Visit: Payer: BC Managed Care – PPO | Admitting: Physical Therapy

## 2011-05-26 ENCOUNTER — Ambulatory Visit: Payer: BC Managed Care – PPO | Admitting: Physical Therapy

## 2011-06-02 ENCOUNTER — Ambulatory Visit: Payer: BC Managed Care – PPO | Admitting: Physical Therapy

## 2011-06-03 ENCOUNTER — Ambulatory Visit: Payer: BC Managed Care – PPO | Admitting: Physical Therapy

## 2011-06-09 ENCOUNTER — Ambulatory Visit: Payer: BC Managed Care – PPO | Admitting: Physical Therapy

## 2011-06-11 ENCOUNTER — Ambulatory Visit: Payer: BC Managed Care – PPO | Attending: Internal Medicine | Admitting: Physical Therapy

## 2011-06-11 DIAGNOSIS — M545 Low back pain, unspecified: Secondary | ICD-10-CM | POA: Insufficient documentation

## 2011-06-11 DIAGNOSIS — IMO0001 Reserved for inherently not codable concepts without codable children: Secondary | ICD-10-CM | POA: Insufficient documentation

## 2011-06-11 DIAGNOSIS — R293 Abnormal posture: Secondary | ICD-10-CM | POA: Insufficient documentation

## 2011-06-11 DIAGNOSIS — M6281 Muscle weakness (generalized): Secondary | ICD-10-CM | POA: Insufficient documentation

## 2011-06-15 ENCOUNTER — Ambulatory Visit: Payer: BC Managed Care – PPO | Admitting: Physical Therapy

## 2011-06-16 ENCOUNTER — Ambulatory Visit: Payer: BC Managed Care – PPO | Admitting: Physical Therapy

## 2011-06-22 ENCOUNTER — Ambulatory Visit: Payer: BC Managed Care – PPO | Admitting: Physical Therapy

## 2011-06-23 ENCOUNTER — Ambulatory Visit: Payer: BC Managed Care – PPO | Admitting: Physical Therapy

## 2012-07-25 ENCOUNTER — Ambulatory Visit: Payer: Medicare Other | Attending: Internal Medicine | Admitting: Rehabilitation

## 2012-07-25 DIAGNOSIS — M545 Low back pain, unspecified: Secondary | ICD-10-CM | POA: Insufficient documentation

## 2012-07-25 DIAGNOSIS — IMO0001 Reserved for inherently not codable concepts without codable children: Secondary | ICD-10-CM | POA: Insufficient documentation

## 2012-07-25 DIAGNOSIS — M25559 Pain in unspecified hip: Secondary | ICD-10-CM | POA: Insufficient documentation

## 2012-08-02 ENCOUNTER — Ambulatory Visit: Payer: Medicare Other | Admitting: Rehabilitation

## 2012-08-04 ENCOUNTER — Ambulatory Visit: Payer: Medicare Other | Admitting: Rehabilitation

## 2012-08-08 ENCOUNTER — Ambulatory Visit: Payer: Medicare Other | Attending: Internal Medicine | Admitting: Rehabilitation

## 2012-08-08 DIAGNOSIS — M545 Low back pain, unspecified: Secondary | ICD-10-CM | POA: Insufficient documentation

## 2012-08-08 DIAGNOSIS — M25559 Pain in unspecified hip: Secondary | ICD-10-CM | POA: Insufficient documentation

## 2012-08-08 DIAGNOSIS — IMO0001 Reserved for inherently not codable concepts without codable children: Secondary | ICD-10-CM | POA: Insufficient documentation

## 2012-08-10 ENCOUNTER — Ambulatory Visit: Payer: Medicare Other | Admitting: Rehabilitation

## 2012-08-15 ENCOUNTER — Ambulatory Visit: Payer: Medicare Other | Admitting: Rehabilitation

## 2012-08-17 ENCOUNTER — Ambulatory Visit: Payer: Medicare Other | Admitting: Rehabilitation

## 2012-08-22 ENCOUNTER — Ambulatory Visit: Payer: Medicare Other | Admitting: Rehabilitation

## 2012-08-24 ENCOUNTER — Ambulatory Visit: Payer: Medicare Other | Admitting: Rehabilitation

## 2012-08-29 ENCOUNTER — Ambulatory Visit: Payer: Medicare Other | Admitting: Rehabilitation

## 2012-08-31 ENCOUNTER — Ambulatory Visit: Payer: Medicare Other | Admitting: Rehabilitation

## 2012-09-05 ENCOUNTER — Ambulatory Visit: Payer: Medicare Other | Attending: Internal Medicine | Admitting: Rehabilitation

## 2012-09-05 DIAGNOSIS — M545 Low back pain, unspecified: Secondary | ICD-10-CM | POA: Insufficient documentation

## 2012-09-05 DIAGNOSIS — IMO0001 Reserved for inherently not codable concepts without codable children: Secondary | ICD-10-CM | POA: Insufficient documentation

## 2012-09-05 DIAGNOSIS — M25559 Pain in unspecified hip: Secondary | ICD-10-CM | POA: Insufficient documentation

## 2012-09-07 ENCOUNTER — Ambulatory Visit: Payer: Medicare Other | Admitting: Rehabilitation

## 2012-09-12 ENCOUNTER — Ambulatory Visit: Payer: Medicare Other | Admitting: Rehabilitation

## 2012-09-14 ENCOUNTER — Ambulatory Visit: Payer: Medicare Other | Admitting: Rehabilitation

## 2012-09-19 ENCOUNTER — Ambulatory Visit: Payer: Medicare Other | Admitting: Rehabilitation

## 2012-09-21 ENCOUNTER — Ambulatory Visit: Payer: Medicare Other | Admitting: Rehabilitation

## 2012-09-26 ENCOUNTER — Ambulatory Visit: Payer: Medicare Other | Admitting: Rehabilitation

## 2012-09-28 ENCOUNTER — Encounter: Payer: Medicare Other | Admitting: Rehabilitation

## 2012-10-03 ENCOUNTER — Encounter: Payer: Medicare Other | Admitting: Rehabilitation

## 2012-10-05 ENCOUNTER — Encounter: Payer: Medicare Other | Admitting: Rehabilitation

## 2012-10-10 ENCOUNTER — Ambulatory Visit: Payer: Medicare Other | Admitting: Rehabilitation

## 2012-10-12 ENCOUNTER — Ambulatory Visit: Payer: Medicare Other | Attending: Internal Medicine | Admitting: Rehabilitation

## 2012-10-12 DIAGNOSIS — M25559 Pain in unspecified hip: Secondary | ICD-10-CM | POA: Insufficient documentation

## 2012-10-12 DIAGNOSIS — IMO0001 Reserved for inherently not codable concepts without codable children: Secondary | ICD-10-CM | POA: Insufficient documentation

## 2012-10-12 DIAGNOSIS — M545 Low back pain, unspecified: Secondary | ICD-10-CM | POA: Insufficient documentation

## 2012-10-17 ENCOUNTER — Encounter: Payer: Medicare Other | Admitting: Rehabilitation

## 2012-10-19 ENCOUNTER — Encounter: Payer: Medicare Other | Admitting: Rehabilitation

## 2012-10-24 ENCOUNTER — Ambulatory Visit: Payer: Medicare Other | Admitting: Rehabilitation

## 2012-10-26 ENCOUNTER — Ambulatory Visit: Payer: Medicare Other | Admitting: Rehabilitation

## 2012-10-31 ENCOUNTER — Encounter: Payer: Medicare Other | Admitting: Rehabilitation

## 2012-11-02 ENCOUNTER — Encounter: Payer: Medicare Other | Admitting: Rehabilitation

## 2012-11-07 ENCOUNTER — Encounter: Payer: Medicare Other | Admitting: Rehabilitation

## 2012-11-09 ENCOUNTER — Encounter: Payer: Medicare Other | Admitting: Rehabilitation

## 2012-11-14 ENCOUNTER — Encounter: Payer: Medicare Other | Admitting: Rehabilitation

## 2012-11-16 ENCOUNTER — Encounter: Payer: Medicare Other | Admitting: Rehabilitation

## 2012-11-21 ENCOUNTER — Ambulatory Visit: Payer: Medicare Other | Admitting: Rehabilitation

## 2012-11-23 ENCOUNTER — Encounter: Payer: Medicare Other | Admitting: Rehabilitation

## 2012-11-28 ENCOUNTER — Encounter: Payer: Medicare Other | Admitting: Rehabilitation

## 2012-11-30 ENCOUNTER — Ambulatory Visit: Payer: Medicare Other | Admitting: Rehabilitation

## 2013-09-01 ENCOUNTER — Other Ambulatory Visit (HOSPITAL_COMMUNITY): Payer: Self-pay | Admitting: Cardiology

## 2013-09-01 DIAGNOSIS — R079 Chest pain, unspecified: Secondary | ICD-10-CM

## 2013-09-08 ENCOUNTER — Encounter (HOSPITAL_COMMUNITY): Payer: Medicare Other

## 2013-09-08 ENCOUNTER — Ambulatory Visit (HOSPITAL_COMMUNITY): Payer: Medicare Other

## 2013-10-24 DIAGNOSIS — F411 Generalized anxiety disorder: Secondary | ICD-10-CM | POA: Insufficient documentation

## 2013-10-26 ENCOUNTER — Other Ambulatory Visit (HOSPITAL_COMMUNITY): Payer: Self-pay | Admitting: Cardiology

## 2013-10-26 DIAGNOSIS — R079 Chest pain, unspecified: Secondary | ICD-10-CM

## 2013-11-01 ENCOUNTER — Encounter (HOSPITAL_COMMUNITY)
Admission: RE | Admit: 2013-11-01 | Discharge: 2013-11-01 | Disposition: A | Discharge status: Home / Self Care | Payer: Medicare Other | Source: Ambulatory Visit | Attending: Cardiology | Admitting: Cardiology

## 2013-11-01 ENCOUNTER — Other Ambulatory Visit: Payer: Self-pay

## 2013-11-01 DIAGNOSIS — R079 Chest pain, unspecified: Secondary | ICD-10-CM | POA: Insufficient documentation

## 2013-11-01 LAB — BASIC METABOLIC PANEL
BUN: 23 mg/dL (ref 6–23)
CHLORIDE: 101 meq/L (ref 96–112)
CO2: 28 mEq/L (ref 19–32)
Calcium: 9.7 mg/dL (ref 8.4–10.5)
Creatinine, Ser: 0.76 mg/dL (ref 0.50–1.10)
GFR calc non Af Amer: 82 mL/min — ABNORMAL LOW (ref >90)
Glucose, Bld: 90 mg/dL (ref 70–99)
POTASSIUM: 5.2 meq/L (ref 3.7–5.3)
Sodium: 141 mEq/L (ref 137–147)

## 2013-11-01 LAB — LIPID PANEL
CHOL/HDL RATIO: 2.6 ratio
Cholesterol: 142 mg/dL (ref 0–200)
HDL: 55 mg/dL (ref >39)
LDL CALC: 52 mg/dL (ref 0–99)
Triglycerides: 175 mg/dL — ABNORMAL HIGH (ref <150)
VLDL: 35 mg/dL (ref 0–40)

## 2013-11-01 LAB — HEPATIC FUNCTION PANEL
ALT: 12 U/L (ref 0–35)
AST: 15 U/L (ref 0–37)
Albumin: 3.7 g/dL (ref 3.5–5.2)
Alkaline Phosphatase: 71 U/L (ref 39–117)
TOTAL PROTEIN: 7.4 g/dL (ref 6.0–8.3)
Total Bilirubin: 0.6 mg/dL (ref 0.3–1.2)

## 2013-11-01 LAB — TSH: TSH: 0.876 u[IU]/mL (ref 0.350–4.500)

## 2013-11-01 MED ORDER — TECHNETIUM TC 99M SESTAMIBI GENERIC - CARDIOLITE
30.0000 | Freq: Once | INTRAVENOUS | Status: AC | PRN
  Administered 2013-11-01: 30 via INTRAVENOUS

## 2013-11-01 MED ORDER — TECHNETIUM TC 99M SESTAMIBI GENERIC - CARDIOLITE
10.0000 | Freq: Once | INTRAVENOUS | Status: AC | PRN
  Administered 2013-11-01: 10 via INTRAVENOUS

## 2013-11-01 MED ORDER — REGADENOSON 0.4 MG/5ML IV SOLN
INTRAVENOUS | Status: AC
  Administered 2013-11-01: 0.4 mg via INTRAVENOUS
  Filled 2013-11-01: qty 5

## 2013-11-01 MED ORDER — REGADENOSON 0.4 MG/5ML IV SOLN
0.4000 mg | Freq: Once | INTRAVENOUS | Status: AC
  Administered 2013-11-01: 0.4 mg via INTRAVENOUS

## 2014-02-25 DIAGNOSIS — M792 Neuralgia and neuritis, unspecified: Secondary | ICD-10-CM | POA: Insufficient documentation

## 2014-02-25 DIAGNOSIS — M1990 Unspecified osteoarthritis, unspecified site: Secondary | ICD-10-CM | POA: Insufficient documentation

## 2014-02-25 DIAGNOSIS — M8580 Other specified disorders of bone density and structure, unspecified site: Secondary | ICD-10-CM | POA: Insufficient documentation

## 2014-02-25 DIAGNOSIS — K760 Fatty (change of) liver, not elsewhere classified: Secondary | ICD-10-CM | POA: Insufficient documentation

## 2014-02-25 DIAGNOSIS — M5137 Other intervertebral disc degeneration, lumbosacral region: Secondary | ICD-10-CM | POA: Insufficient documentation

## 2014-06-26 DIAGNOSIS — E039 Hypothyroidism, unspecified: Secondary | ICD-10-CM | POA: Insufficient documentation

## 2016-08-18 DIAGNOSIS — J189 Pneumonia, unspecified organism: Secondary | ICD-10-CM | POA: Insufficient documentation

## 2018-04-07 DIAGNOSIS — M7061 Trochanteric bursitis, right hip: Secondary | ICD-10-CM | POA: Insufficient documentation

## 2018-04-07 DIAGNOSIS — M1712 Unilateral primary osteoarthritis, left knee: Secondary | ICD-10-CM | POA: Insufficient documentation

## 2018-04-07 DIAGNOSIS — G8929 Other chronic pain: Secondary | ICD-10-CM | POA: Insufficient documentation

## 2018-04-07 DIAGNOSIS — M170 Bilateral primary osteoarthritis of knee: Secondary | ICD-10-CM | POA: Insufficient documentation

## 2018-04-07 DIAGNOSIS — M7062 Trochanteric bursitis, left hip: Secondary | ICD-10-CM | POA: Insufficient documentation

## 2019-06-15 DIAGNOSIS — M65321 Trigger finger, right index finger: Secondary | ICD-10-CM | POA: Insufficient documentation

## 2019-07-31 DIAGNOSIS — H25812 Combined forms of age-related cataract, left eye: Secondary | ICD-10-CM | POA: Insufficient documentation

## 2019-08-04 DIAGNOSIS — U071 COVID-19: Secondary | ICD-10-CM

## 2019-08-04 HISTORY — DX: COVID-19: U07.1

## 2020-01-24 ENCOUNTER — Other Ambulatory Visit: Payer: Self-pay | Admitting: *Deleted

## 2020-01-24 DIAGNOSIS — M79662 Pain in left lower leg: Secondary | ICD-10-CM

## 2020-01-24 DIAGNOSIS — M25559 Pain in unspecified hip: Secondary | ICD-10-CM

## 2020-01-25 ENCOUNTER — Ambulatory Visit
Admission: RE | Admit: 2020-01-25 | Discharge: 2020-01-25 | Disposition: A | Discharge status: Home / Self Care | Payer: Medicare PPO | Source: Ambulatory Visit | Attending: *Deleted | Admitting: *Deleted

## 2020-01-25 ENCOUNTER — Ambulatory Visit
Admission: RE | Admit: 2020-01-25 | Discharge: 2020-01-25 | Disposition: A | Discharge status: Home / Self Care | Payer: Medicare Other | Source: Ambulatory Visit | Attending: *Deleted | Admitting: *Deleted

## 2020-01-25 DIAGNOSIS — M79662 Pain in left lower leg: Secondary | ICD-10-CM

## 2020-01-25 DIAGNOSIS — M25559 Pain in unspecified hip: Secondary | ICD-10-CM

## 2020-01-25 IMAGING — US US EXTREM LOW VENOUS*L*
1 series · 13 of 24 positions shown · non-contrast
Comparison: None.

CLINICAL DATA: Left lower extremity pain and edema. Evaluate for
DVT.



[Series 1: us extrem low venous*left* · 0.08mm/px · 13 of 34 slices shown]
[im 1/34]
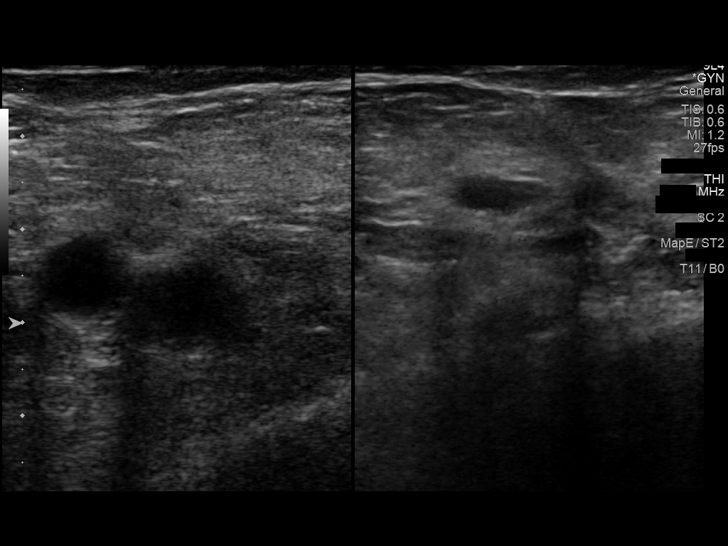
[im 3/34]
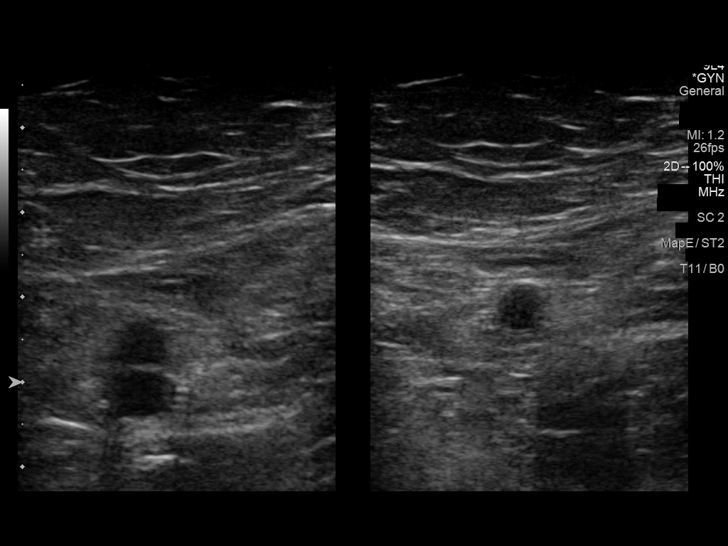
[im 6/34]
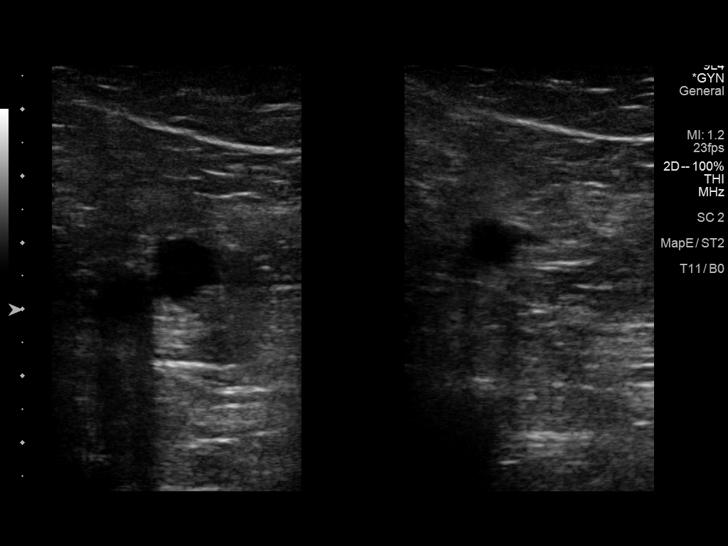
[im 9/34]
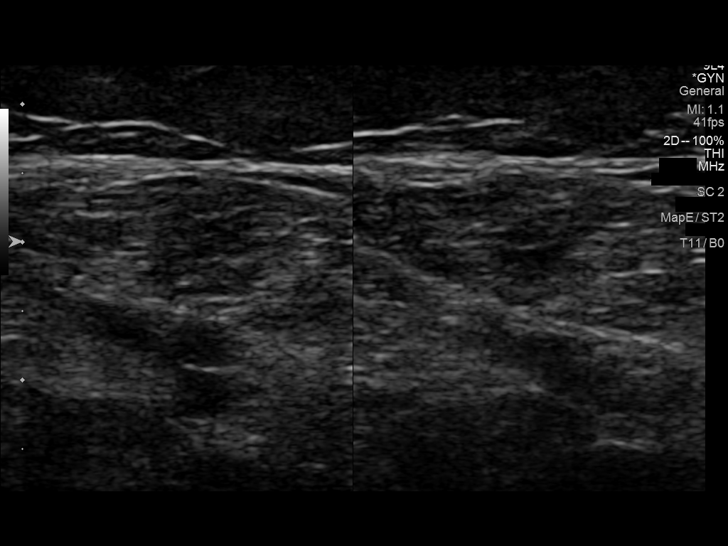
[im 12/34]
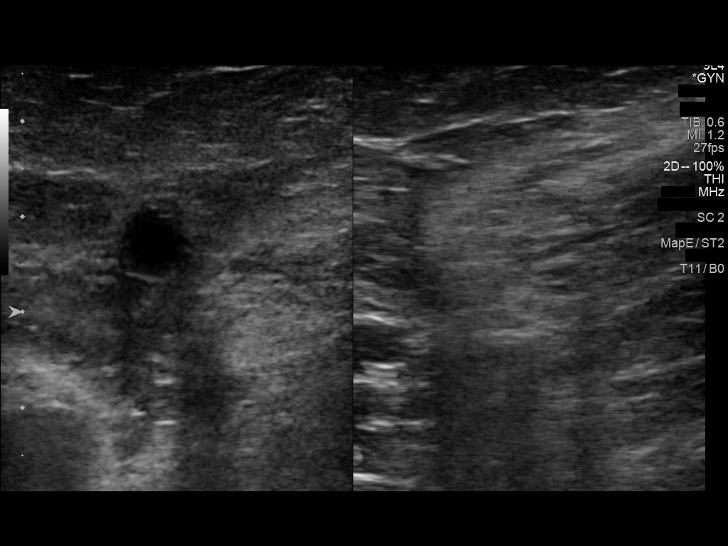
[im 15/34]
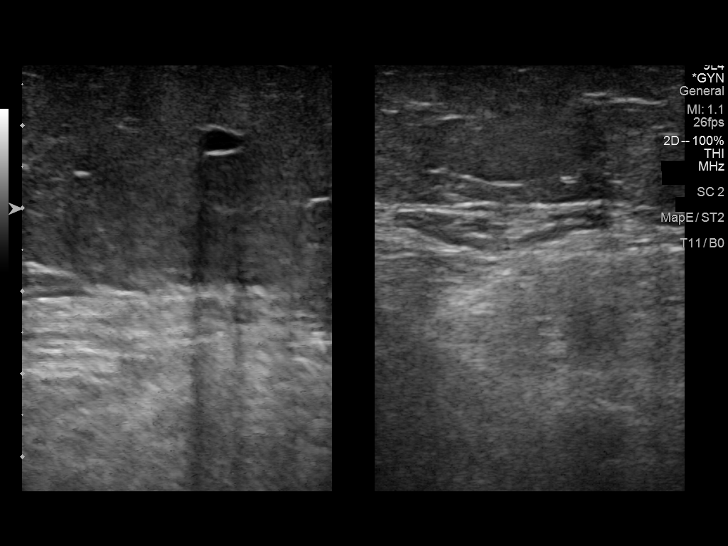
[im 18/34]
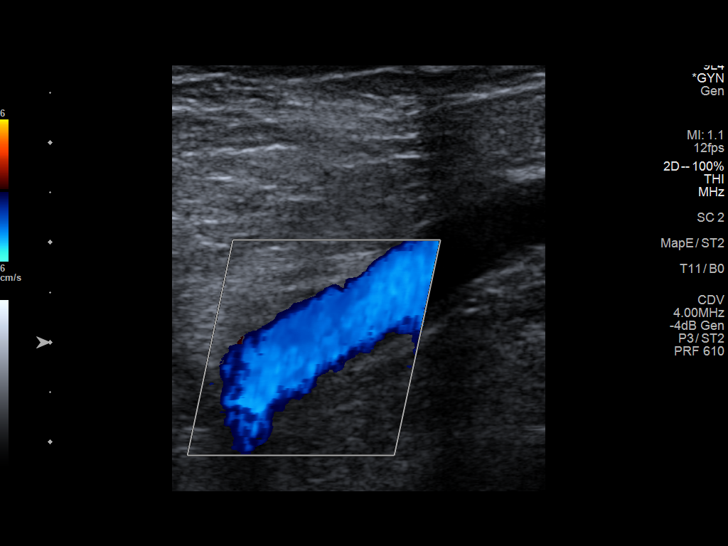
[im 19/34]
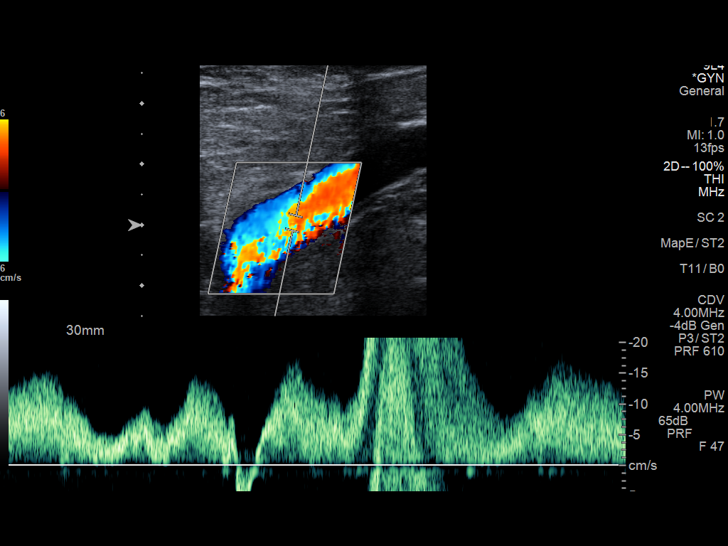
[im 22/34]
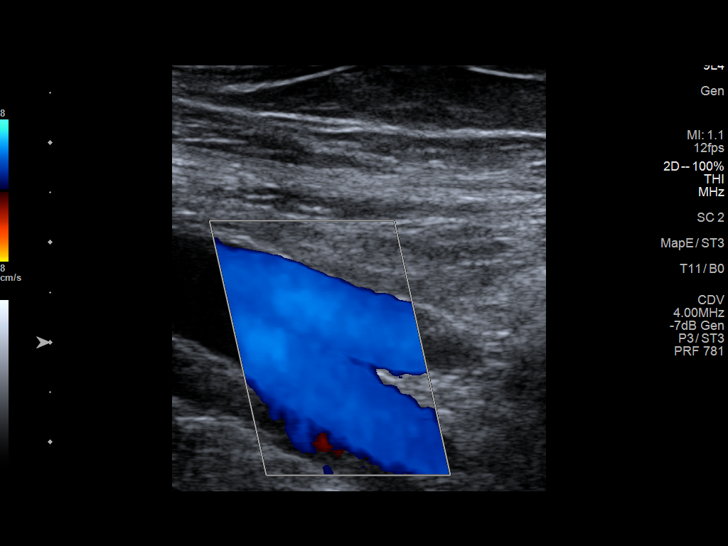
[im 25/34]
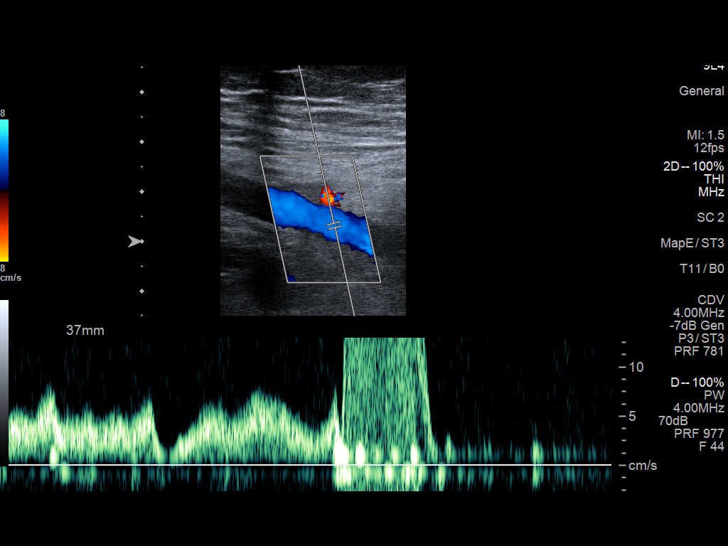
[im 28/34]
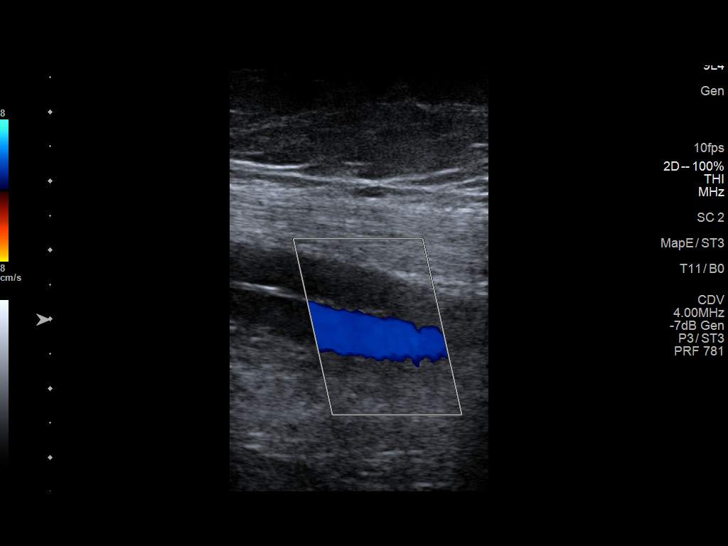
[im 31/34]
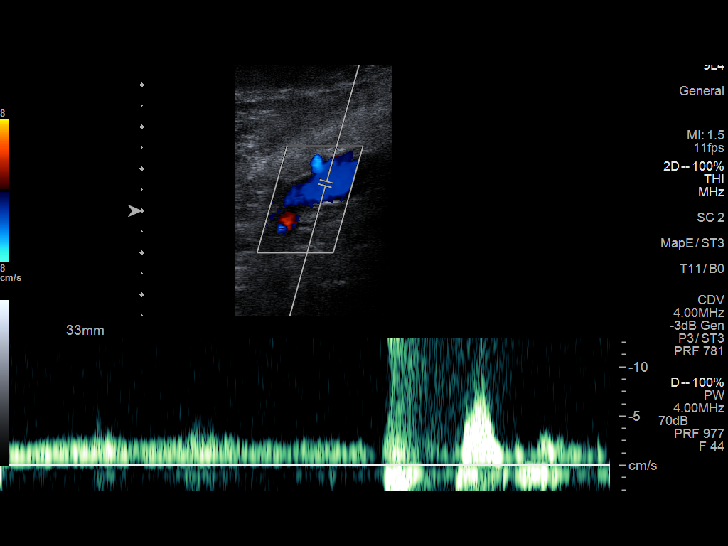
[im 34/34]
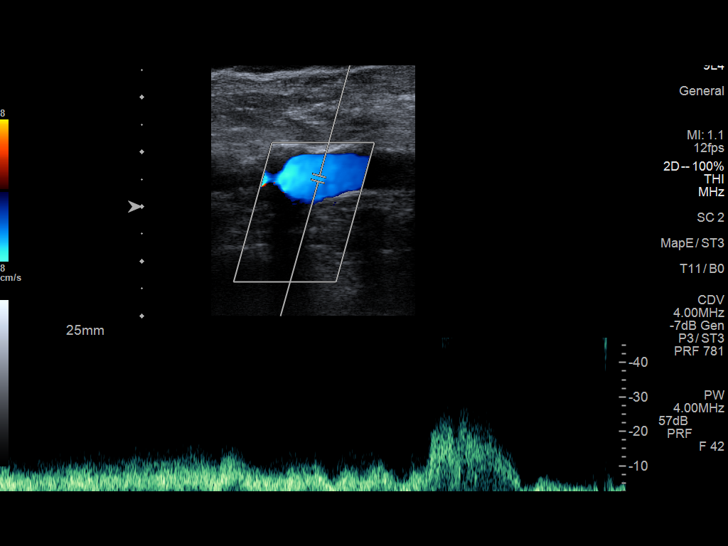

[13 of 24 positions shown; findings below may reference images not displayed]

FINDINGS: Contralateral Common Femoral Vein: Respiratory phasicity is normal
and symmetric with the symptomatic side. No evidence of thrombus.
Normal compressibility.

Common Femoral Vein: No evidence of thrombus. Normal
compressibility, respiratory phasicity and response to augmentation.

Saphenofemoral Junction: No evidence of thrombus. Normal
compressibility and flow on color Doppler imaging.

Profunda Femoral Vein: No evidence of thrombus. Normal
compressibility and flow on color Doppler imaging.

Femoral Vein: No evidence of thrombus. Normal compressibility,
respiratory phasicity and response to augmentation.

Popliteal Vein: No evidence of thrombus. Normal compressibility,
respiratory phasicity and response to augmentation.

Calf Veins: No evidence of thrombus. Normal compressibility and flow
on color Doppler imaging.

Superficial Great Saphenous Vein: No evidence of thrombus. Normal
compressibility.

Venous Reflux:  None.

Other Findings:  None.
IMPRESSION: No evidence of DVT within the left lower extremity.

## 2020-01-25 IMAGING — CR DG HIP (WITH OR WITHOUT PELVIS) 3-4V BILAT
3 series · 3 of 3 positions shown · non-contrast
Comparison: None.

CLINICAL DATA: Chronic bilateral hip pain.  No known injury.

EXAM:
DG HIP (WITH OR WITHOUT PELVIS) 3-4V BILAT

[t pelvis a.p.]
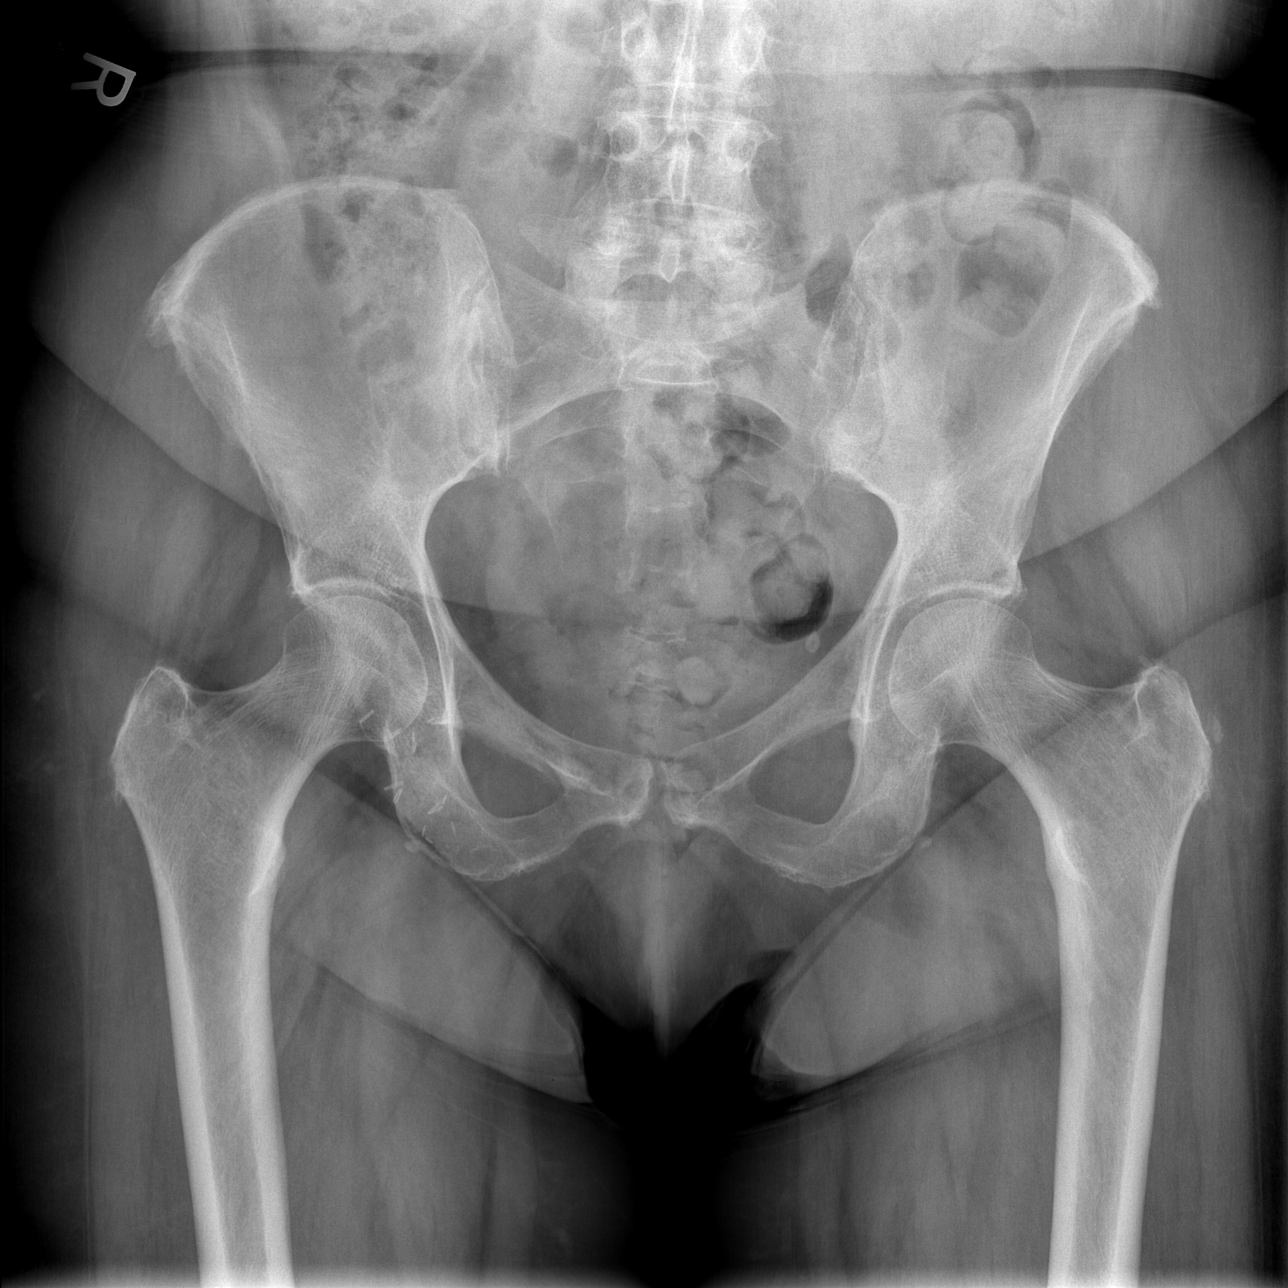

[t hip frog leg left]
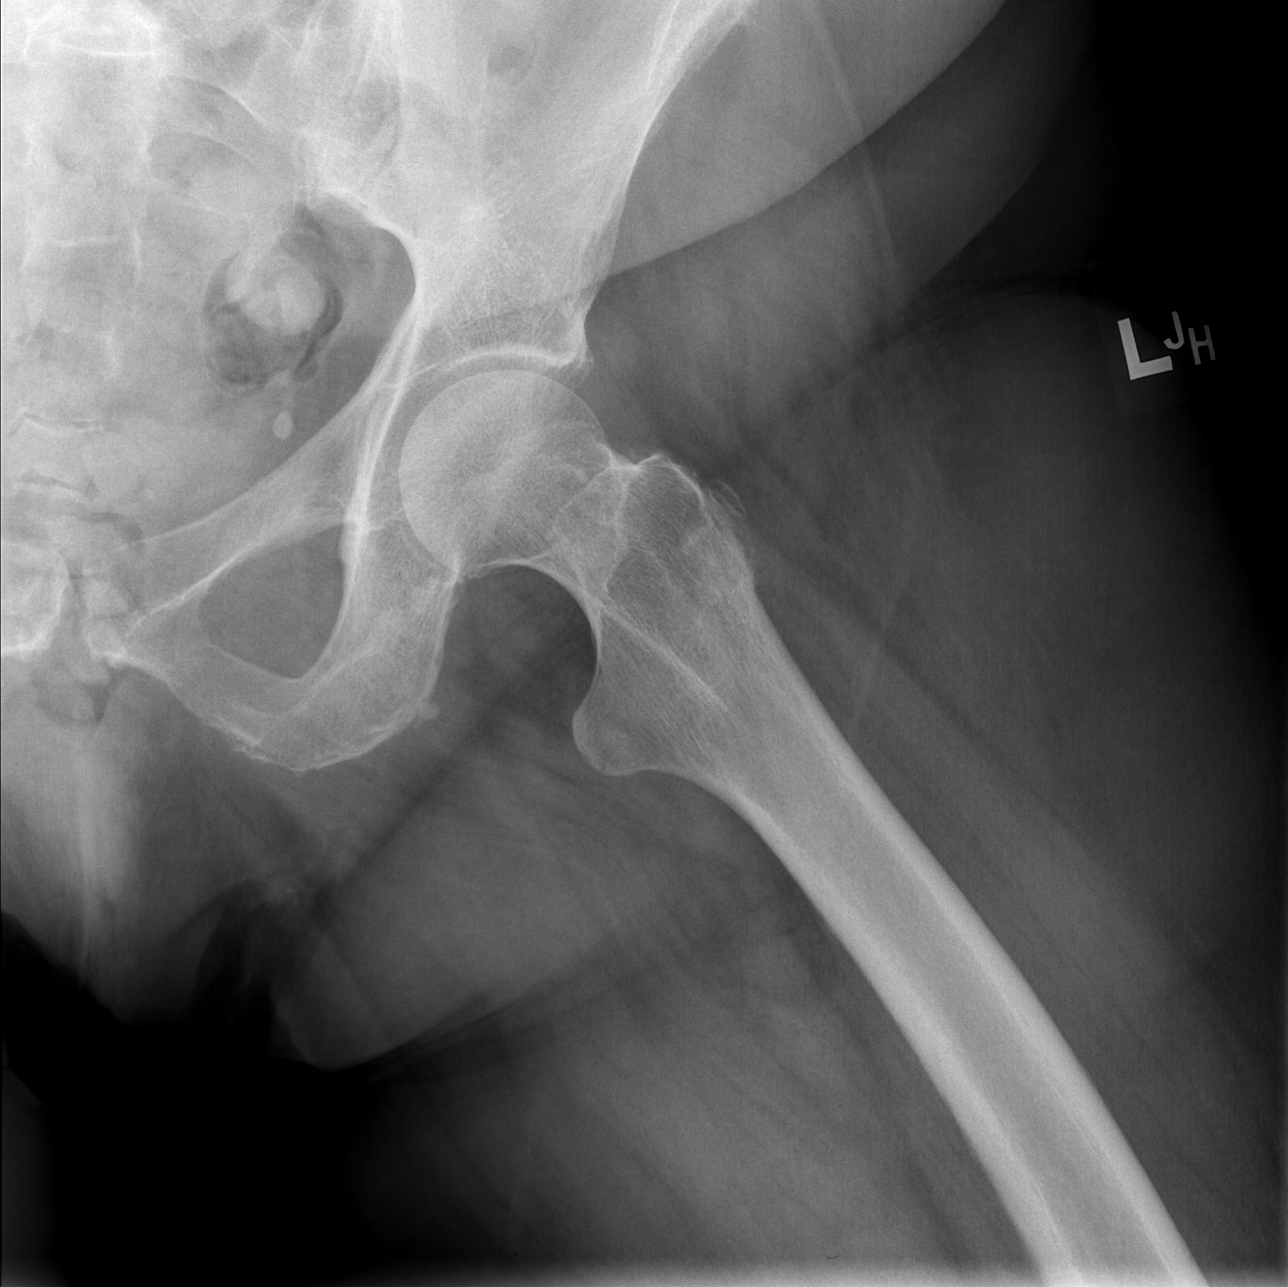

[t hip frog leg right]
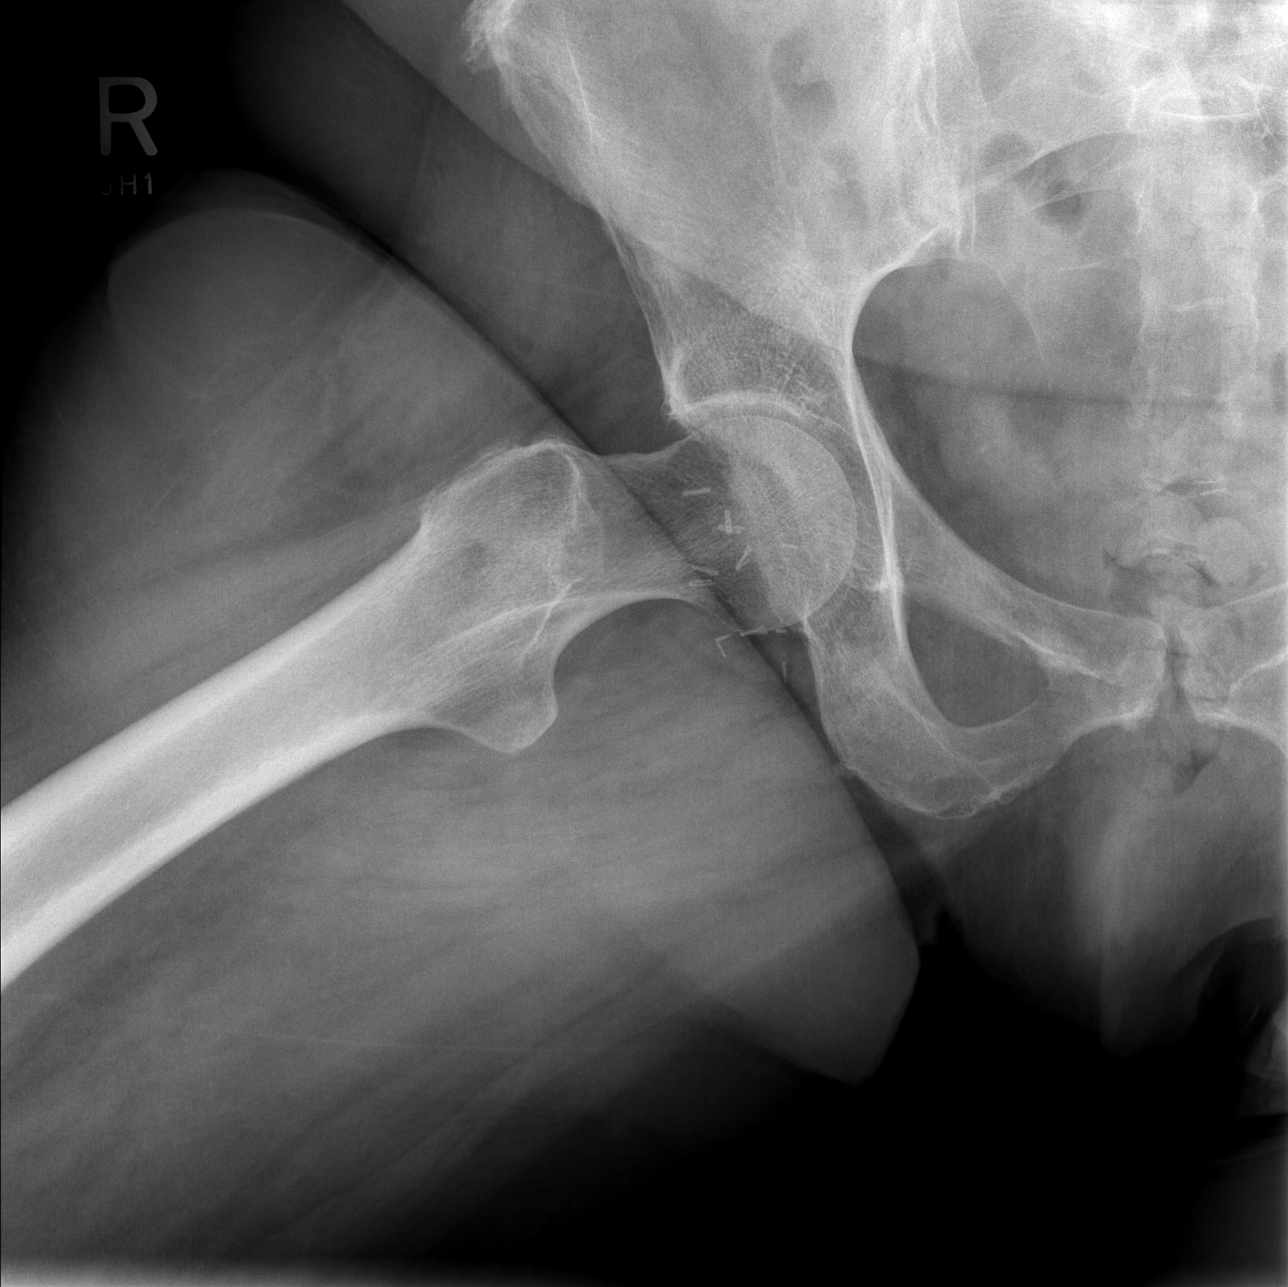

[3 of 3 positions shown; findings below may reference images not displayed]

FINDINGS: No fracture or dislocation. Bilateral hip joint spaces appear
preserved. No evidence of avascular necrosis. Minimal enthesopathic
change involving the bilateral greater trochanters, left greater
than right. Limited visualization of the pelvis demonstrates
punctate ossicles adjacent to the bilateral ischial tuberosities.

Surgical clips overlie the left groin. Punctate phlebolith overlies
the left hemipelvis. Regional soft tissues appear otherwise normal.
IMPRESSION: Minimal enthesopathic change about the bilateral greater
trochanters, left greater than right, as could be seen in the
setting of previous avulsive injury. Otherwise, no explanation
patient's chronic bilateral hip pain.

## 2020-02-09 ENCOUNTER — Encounter: Payer: Self-pay | Admitting: Sports Medicine

## 2020-02-09 ENCOUNTER — Ambulatory Visit (INDEPENDENT_AMBULATORY_CARE_PROVIDER_SITE_OTHER): Payer: Medicare PPO | Admitting: Sports Medicine

## 2020-02-09 ENCOUNTER — Ambulatory Visit (INDEPENDENT_AMBULATORY_CARE_PROVIDER_SITE_OTHER): Payer: Medicare PPO

## 2020-02-09 ENCOUNTER — Other Ambulatory Visit: Payer: Self-pay

## 2020-02-09 DIAGNOSIS — M5416 Radiculopathy, lumbar region: Secondary | ICD-10-CM

## 2020-02-09 IMAGING — DX DG LUMBAR SPINE COMPLETE 4+V
5 series · 5 of 5 positions shown · non-contrast
Comparison: [DATE]

CLINICAL DATA: Low back pain with left lower extremity radicular
type symptoms

EXAM:
LUMBAR SPINE - COMPLETE 4+ VIEW

[l-spine ap]
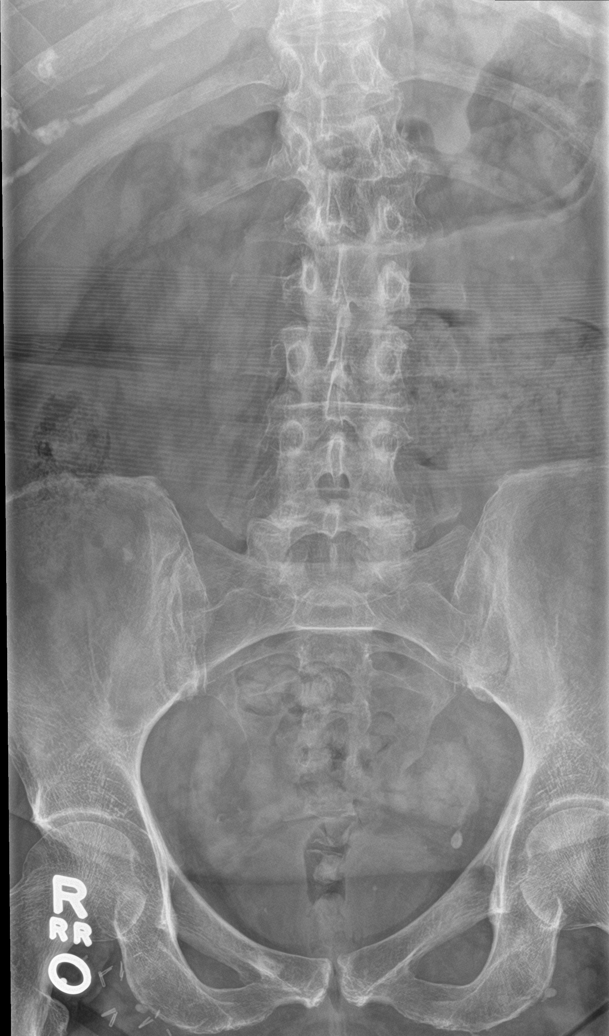

[l-spine obl (1 of 2)]
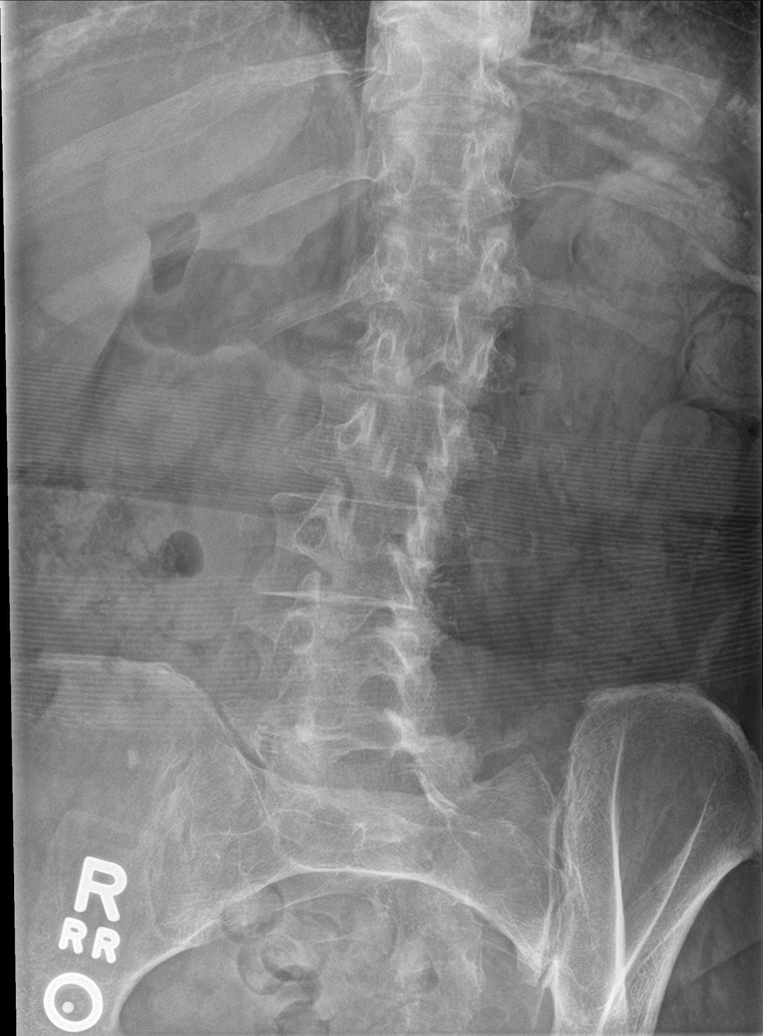

[l-spine obl (2 of 2)]
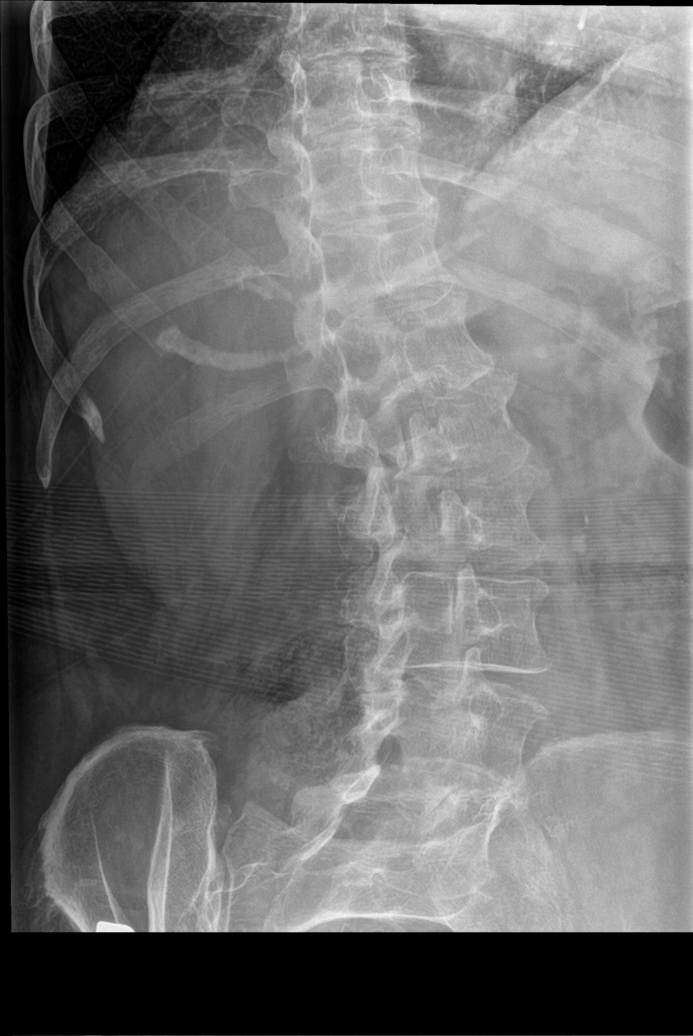

[l-spine lat]
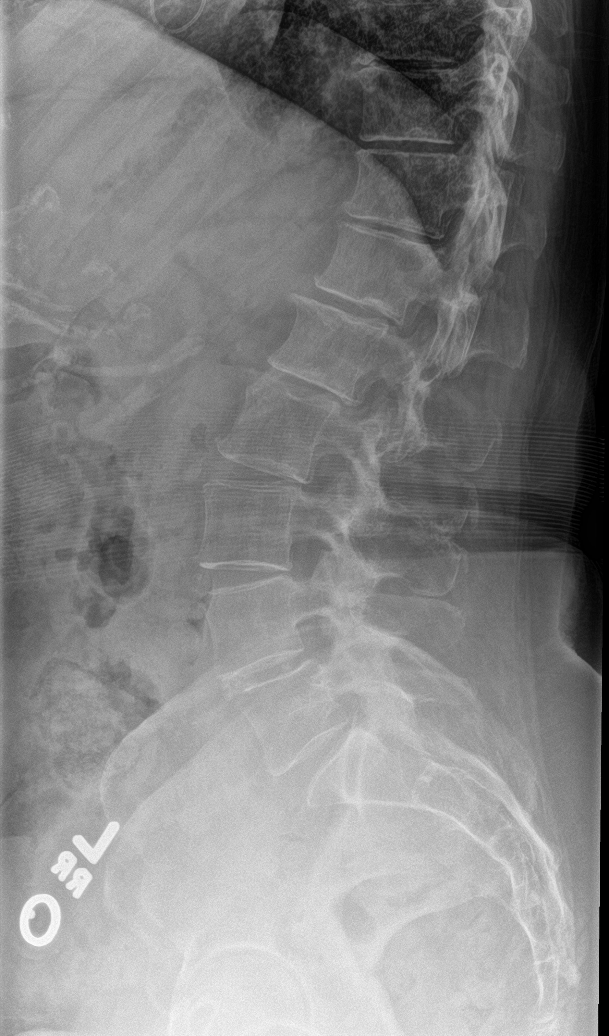

[l-spine spot]
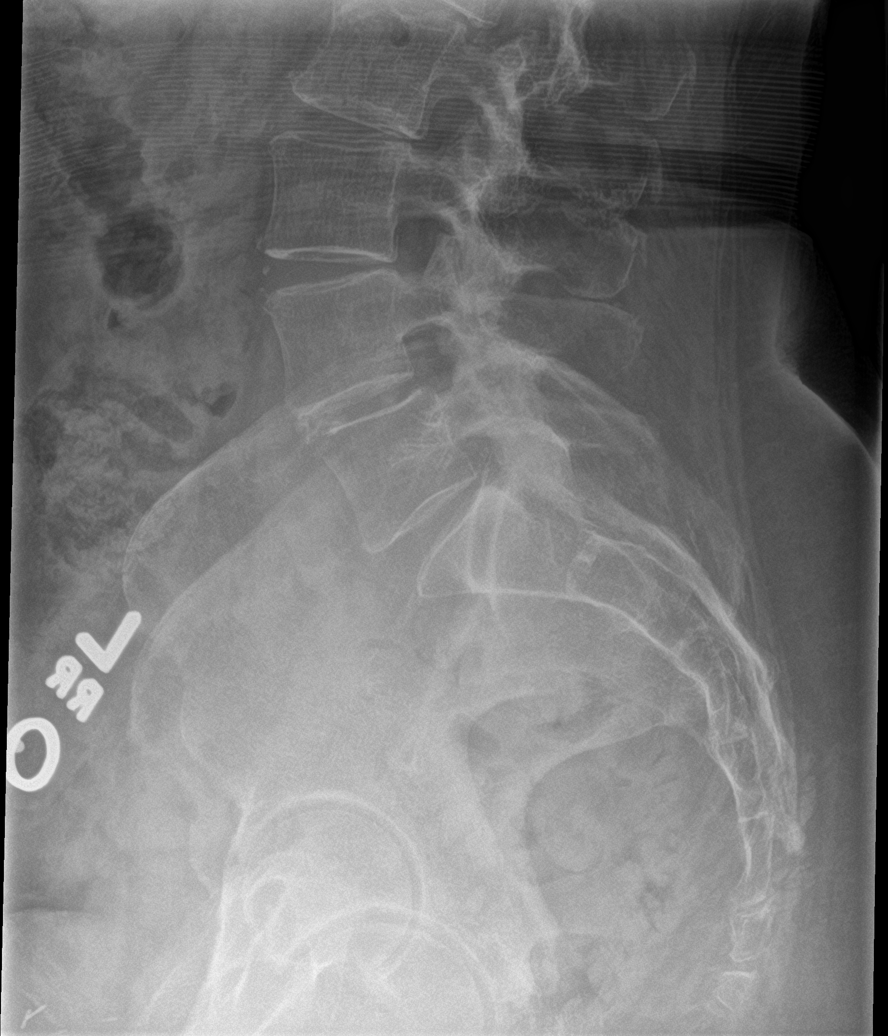

[5 of 5 positions shown; findings below may reference images not displayed]

FINDINGS: Frontal, lateral, spot lumbosacral lateral, and bilateral oblique
views were obtained. There are 5 non-rib-bearing lumbar type
vertebral bodies. There is mild thoracolumbar levoscoliosis with
mild rotatory component. There is no appreciable fracture. There is
stable 2 mm of retrolisthesis of T12 on L1 and 1 mm of
retrolisthesis of L1 on L2. No new spondylolisthesis. There is mild
to moderate disc space narrowing at all levels, essentially stable.
There is facet osteoarthritic change at L4-5 and L5-S1 bilaterally.
IMPRESSION: Scoliosis. Multilevel osteoarthritic change, primarily at L4-5 and
L5-S1. Slight spondylolisthesis at T12-L1 and L1-2 is stable and
likely due to underlying spondylosis. No fracture evident.

## 2020-02-09 MED ORDER — PREDNISONE 50 MG PO TABS: ORAL_TABLET | ORAL | 0 refills | Status: DC

## 2020-02-09 MED ORDER — MELOXICAM 15 MG PO TABS: ORAL_TABLET | ORAL | 3 refills | Status: DC

## 2020-02-09 MED ORDER — GABAPENTIN 300 MG PO CAPS: ORAL_CAPSULE | ORAL | 3 refills | Status: DC

## 2020-02-09 NOTE — Progress Notes (Signed)
    Procedures performed today:    None.  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  Brief History, Exam, Impression, and Recommendations:    Left lumbar radiculitis This is a pleasant 79 year old female, she has a long history of low back pain, now radiating down the left leg. Worse with prolonged sitting. We had a long discussion about what could be causing her pain, I am adding 5 days of prednisone followed by meloxicam, increasing gabapentin, adding x-rays, formal physical therapy. Return to see me in 4 to 6 weeks, MRI for interventional planning if no better.    ___________________________________________ Ihor Austin. Benjamin Stain, M.D., ABFM., CAQSM. Primary Care and Sports Medicine [facility]  Adjunct Instructor of Family Medicine  [employer]

## 2020-02-09 NOTE — Assessment & Plan Note (Signed)
This is a pleasant 79 year old female, she has a long history of low back pain, now radiating down the left leg. Worse with prolonged sitting. We had a long discussion about what could be causing her pain, I am adding 5 days of prednisone followed by meloxicam, increasing gabapentin, adding x-rays, formal physical therapy. Return to see me in 4 to 6 weeks, MRI for interventional planning if no better.

## 2020-02-19 ENCOUNTER — Telehealth: Payer: Self-pay

## 2020-02-19 MED ORDER — DICLOFENAC SODIUM 75 MG PO TBEC
75.0000 mg | DELAYED_RELEASE_TABLET | Freq: Two times a day (BID) | ORAL | 3 refills | Status: DC
Start: 2020-02-19 — End: 2020-03-20

## 2020-02-19 NOTE — Telephone Encounter (Signed)
No problem, however all NSAIDs have the potential to cause benign mild swelling of the extremities and fluid retention.  Switching to Voltaren 75 mg p.o. twice daily.

## 2020-02-19 NOTE — Telephone Encounter (Signed)
Emry called and left a message stating the Mobic is causing swelling in her feet. She would like a different medication. Please advise.

## 2020-02-20 NOTE — Telephone Encounter (Signed)
Patient advised.

## 2020-03-14 ENCOUNTER — Telehealth: Payer: Self-pay

## 2020-03-14 DIAGNOSIS — M7989 Other specified soft tissue disorders: Secondary | ICD-10-CM

## 2020-03-14 DIAGNOSIS — M79662 Pain in left lower leg: Secondary | ICD-10-CM

## 2020-03-14 NOTE — Telephone Encounter (Signed)
Patient called with some recent leg pain located on back of the knee. She states that she has had some swelling and pain, and didn't know if she maybe needed an MRI. Patient already has follow up with Dr T on 03/21/20 and wanted to know if we thought it was OK to wait and be evaluated on that day. Patient also noted that she had a fall yesterday.   I advised patient that Dr Karie Schwalbe does not have any availability prior to her upcoming appointment, but I did encourage her to reach out to her PCP concerning leg pain, to make sure that this was not a possible blood clot. She was agreeable to this plan, states she has had to have U/S in the past to rule out DVT. I also advised her that her PCP needs to know about her recent fall and evaluate for that. Patient agreeable, will be seen by PCP and keeping her appt with Dr T on 8/19

## 2020-03-14 NOTE — Addendum Note (Signed)
Addended by: Monica Becton on: 03/14/2020 10:07 AM   Modules accepted: Orders

## 2020-03-14 NOTE — Telephone Encounter (Signed)
Thank you Fleet Contras, I have reviewed the results, they are negative for DVT, she can see me or any one of my partners when an appointment is available.

## 2020-03-14 NOTE — Telephone Encounter (Signed)
Yes, it is the left leg.   Patient advised. I have contacted [facility], they are reaching out to patient to give her a time to come in to avoid long wait time. Patient is OK with traveling to med center and has no further needs at this time.   Thank you, Dr T!

## 2020-03-14 NOTE — Telephone Encounter (Signed)
If this is the left leg I can absolutely pull the trigger for a stat ultrasound, but if no DVT then it can wait until she has the appointment with me.  Ordering the DVT ultrasound now.  I think she will have to get this done at [facility] due to staff shortages.

## 2020-03-14 NOTE — Addendum Note (Signed)
Addended by: Jed Limerick on: 03/14/2020 11:34 AM   Modules accepted: Orders

## 2020-03-14 NOTE — Telephone Encounter (Signed)
Patient received call from [facility], cannot be seen today due to no tech. Sh was advised she needed to wait until Saturday.   Since this is a STAT ORDER, I called and scheduled patient with [facility] today at 3:45. Orders faxed, patient aware.

## 2020-03-15 NOTE — Telephone Encounter (Signed)
Patient advised, keeping appt on Thursday

## 2020-03-20 ENCOUNTER — Other Ambulatory Visit: Payer: Self-pay

## 2020-03-20 ENCOUNTER — Encounter: Payer: Self-pay | Admitting: Emergency Medicine

## 2020-03-20 ENCOUNTER — Emergency Department (INDEPENDENT_AMBULATORY_CARE_PROVIDER_SITE_OTHER)
Admission: EM | Admit: 2020-03-20 | Discharge: 2020-03-20 | Disposition: A | Discharge status: Home / Self Care | Payer: Medicare PPO | Source: Home / Self Care | Attending: Emergency Medicine | Admitting: Emergency Medicine

## 2020-03-20 DIAGNOSIS — L03114 Cellulitis of left upper limb: Secondary | ICD-10-CM

## 2020-03-20 DIAGNOSIS — T7840XA Allergy, unspecified, initial encounter: Secondary | ICD-10-CM

## 2020-03-20 DIAGNOSIS — S60562A Insect bite (nonvenomous) of left hand, initial encounter: Secondary | ICD-10-CM

## 2020-03-20 DIAGNOSIS — W57XXXA Bitten or stung by nonvenomous insect and other nonvenomous arthropods, initial encounter: Secondary | ICD-10-CM

## 2020-03-20 HISTORY — DX: Disorder of kidney and ureter, unspecified: N28.9

## 2020-03-20 MED ORDER — PREDNISONE 20 MG PO TABS
20.0000 mg | ORAL_TABLET | Freq: Two times a day (BID) | ORAL | 0 refills | Status: DC
Start: 2020-03-20 — End: 2020-05-16

## 2020-03-20 MED ORDER — DOXYCYCLINE HYCLATE 100 MG PO CAPS
100.0000 mg | ORAL_CAPSULE | Freq: Two times a day (BID) | ORAL | 0 refills | Status: DC
Start: 2020-03-20 — End: 2020-05-16

## 2020-03-20 MED ORDER — METHYLPREDNISOLONE ACETATE 80 MG/ML IJ SUSP
80.0000 mg | Freq: Once | INTRAMUSCULAR | Status: AC
  Administered 2020-03-20: 80 mg via INTRAMUSCULAR

## 2020-03-20 MED ORDER — HYDROXYZINE HCL 10 MG PO TABS
10.0000 mg | ORAL_TABLET | Freq: Three times a day (TID) | ORAL | 0 refills | Status: DC | PRN
Start: 2020-03-20 — End: 2022-12-24

## 2020-03-20 NOTE — Discharge Instructions (Signed)
Based on your history and physical exam, you have had hornet bite left hand which is causing an allergic reaction left hand.  It is also possible that you have a mild infection from the bite. Today in urgent care, or giving you a "cortisone shot" of Depo-Medrol. Prescription sent to your pharmacy.  Doxycycline to cover possible infection.  Hydroxyzine, 1 every 8 hours if needed for itch.  Prednisone 20 mg-take twice a day for 5 days. Please read attached instruction sheets on allergic reaction and insect bite. May apply cool compresses to left hand.  Keep left hand elevated. Follow-up with your doctor if not improving in 1 week, sooner if worse or new symptoms.

## 2020-03-20 NOTE — ED Triage Notes (Addendum)
Stung on Left middle finger  by hornet on Monday - cleaned w/ hydrogen peroxide and iced Now presents with swelling to left hand and redness Pt has had Pfizer vaccine

## 2020-03-20 NOTE — ED Provider Notes (Signed)
[name]    CSN: [id number] Arrival date & time: 03/20/20  1036      History   Chief Complaint Chief Complaint  Patient presents with  . Insect Bite  . Cellulitis    HPI Mercedes Reid is a 79 y.o. female.   HPI Stung on Left middle finger by hornet 2 days ago.-at home, she cleaned w/ hydrogen peroxide and iced.  Now presents with progressively worsening swelling to left hand and redness.  She feels left hand itches severely.  Only has minor pain diffusely left hand.  No numbness or weakness left hand.  Had some mild systemic symptoms of lightheadedness and flushing. Otherwise, she denies other systemic symptoms, such as chills, diffuse itching,  ,syncope.  She does have some fatigue.  May have had fever, and a flushed feeling .  Denies headache or chest pain or shortness of breath or cough or nausea or vomiting or diarrhea.  No dysphagia or facial or lip swelling.  Pertinent items noted in HPI and remainder of comprehensive ROS otherwise negative.   Past Medical History:  Diagnosis Date  . H/O CHF   . Hypertension   . Mitral valve prolapse   . Paroxysmal atrial fibrillation (HCC)   . Renal disorder   . Severe mitral regurgitation   . Thyroid disease    HYPOTHYROIDISM    Patient Active Problem List   Diagnosis Date Noted  . Left lumbar radiculitis 02/09/2020  . 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 Trigger index finger of right hand 06/15/2019  . Primary osteoarthritis of left knee 04/07/2018  . Trochanteric bursitis of left hip 04/07/2018  . Community acquired pneumonia of right middle lobe of lung 08/18/2016  . Hypothyroidism (acquired) 06/26/2014  . Disc degeneration, lumbosacral 02/25/2014  . Fatty liver 02/25/2014  . Neuralgia and neuritis 02/25/2014  . Osteoarthritis 02/25/2014  . Osteopenia 02/25/2014  . Anxiety state 10/24/2013  . H/O CHF   . Thyroid disease   . Hypertension   . Paroxysmal atrial fibrillation  (HCC)   . Severe mitral regurgitation   . Mitral valve prolapse     Past Surgical History:  Procedure Laterality Date  . ABDOMINAL HYSTERECTOMY    . COX-MAZE MICROWAVE ABLATION  02/28/2009   Dr. Barry Dienes left side lesion set.  Marland Kitchen MITRAL VALVE REPAIR  03/01/2011   Quadrangular resection of posterior leaflet with leafet plication and 30-mm SorinMEMO 3D ring annuloplasty)                 OB History   No obstetric history on file.      Home Medications    Prior to Admission medications   Medication Sig Start Date End Date Taking? Authorizing Provider  aspirin 81 MG tablet Take 81 mg by mouth daily.      [provider]  doxycycline (VIBRAMYCIN) 100 MG capsule Take 1 capsule (100 mg total) by mouth 2 (two) times daily. For 7 days.  (This is an antibiotic to cover possible infection) 03/20/20   Lajean Manes, MD  estradiol (ESTRACE) 1 MG tablet Take 1 mg by mouth daily.      [provider]  gabapentin (NEURONTIN) 300 MG capsule One tab PO qHS for a week, then BID for a week, then TID. May double weekly to a max of 3,600mg /day 02/09/20   Monica Becton, MD  hydrOXYzine (ATARAX/VISTARIL) 10 MG tablet Take 1 tablet (10 mg total) by mouth every 8 (eight) hours as  needed for itching. Caution: May cause drowsiness 03/20/20   Lajean ManesMassey, Beadie Matsunaga, MD  levothyroxine (SYNTHROID) 100 MCG tablet Take 100 mcg by mouth daily before breakfast.    [provider]  losartan (COZAAR) 100 MG tablet Take 100 mg by mouth daily. 12/20/13   [provider]  metoprolol succinate (TOPROL-XL) 25 MG 24 hr tablet Take 12.5 mg by mouth daily.    [provider]  predniSONE (DELTASONE) 20 MG tablet Take 1 tablet (20 mg total) by mouth 2 (two) times daily with a meal. X 5 days 03/20/20   Lajean ManesMassey, Maryum Batterson, MD  triamterene-hydrochlorothiazide (DYAZIDE) 37.5-25 MG capsule Take 1 capsule by mouth daily.    [provider]    Family History Family History  Problem Relation Age  of Onset  . Heart failure Father     Social History Social History   Tobacco Use  . Smoking status: Never Smoker  . Smokeless tobacco: Never Used  Substance Use Topics  . Alcohol use: No  . Drug use: No     Allergies   Latex, Other, Sulfa antibiotics, and Amoxicillin-pot clavulanate   Review of Systems Review of Systems   Physical Exam Triage Vital Signs ED Triage Vitals  Enc Vitals Group     BP 03/20/20 1110 126/81     Pulse Rate 03/20/20 1110 70     Resp 03/20/20 1110 17     Temp 03/20/20 1110 98.6 F (37 C)     Temp Source 03/20/20 1110 Oral     SpO2 03/20/20 1110 97 %     Weight 03/20/20 1107 172 lb (78 kg)     Height 03/20/20 1107 5\' 5"  (1.651 m)     Head Circumference --      Peak Flow --      Pain Score 03/20/20 1123 3     Pain Loc --      Pain Edu? --      Excl. in GC? --    No data found.  Updated Vital Signs BP 126/81 (BP Location: Right Arm)   Pulse 70   Temp 98.6 F (37 C) (Oral)   Resp 17   Ht 5\' 5"  (1.651 m)   Wt 78 kg   SpO2 97%   BMI 28.62 kg/m    Physical Exam Vitals reviewed.  Constitutional:      General: She is not in acute distress.    Appearance: She is well-developed. She is not toxic-appearing or diaphoretic.  HENT:     Head: Normocephalic and atraumatic.     Mouth/Throat:     Mouth: Mucous membranes are moist.     Pharynx: Oropharynx is clear.  Eyes:     General: No scleral icterus.       Right eye: No discharge.        Left eye: No discharge.     Pupils: Pupils are equal, round, and reactive to light.  Cardiovascular:     Rate and Rhythm: Normal rate and regular rhythm.     Heart sounds: Normal heart sounds.  Pulmonary:     Effort: Pulmonary effort is normal.     Breath sounds: Normal breath sounds.  Abdominal:     General: There is no distension.  Musculoskeletal:     Left hand: Swelling and tenderness present. No bony tenderness. Normal strength. Normal sensation. There is no disruption of two-point  discrimination. Normal capillary refill. Normal pulse.       Hands:     Cervical back:  Normal range of motion and neck supple.     Right lower leg: No edema.     Left lower leg: No edema.     Comments: Very swollen, red, warm diffusely left hand as depicted.  There is mild induration.  No fluctuance or any open wound or drainage or sign of foreign body.-No red streaks.-Neurovascular distally intact.  Skin:    General: Skin is warm and dry.     Capillary Refill: Capillary refill takes less than 2 seconds.  Neurological:     Mental Status: She is alert and oriented to person, place, and time.     Cranial Nerves: No cranial nerve deficit.     Sensory: No sensory deficit.     Motor: No weakness.  Psychiatric:        Behavior: Behavior normal.      UC Treatments / Results  Labs (all labs ordered are listed, but only abnormal results are displayed) Labs Reviewed - No data to display  EKG   Radiology No results found.  Procedures Procedures (including critical care time)  Medications Ordered in UC Medications  methylPREDNISolone acetate (DEPO-MEDROL) injection 80 mg (80 mg Intramuscular Given 03/20/20 1213)    Initial Impression / Assessment and Plan / UC Course  I have reviewed the triage vital signs and the nursing notes.  Pertinent labs & imaging results that were available during my care of the patient were reviewed by me and considered in my medical decision making (see chart for details).     Hornet bite left hand 2 days ago with progressive redness itch and swelling mild pain.  Had some flushing and lightheadedness systemic symptoms. But no generalized urticaria, breathing or swallowing problems, or other systemic symptoms. Likely has two diagnoses:  1) worsening, severe local allergic reaction left hand hornet bite.-Especially likely based on symptoms of severe itch and physical exam. 2) also very suspicious for worsening secondary cellulitis left hand.   Neurovascular intact.  Final Clinical Impressions(s) / UC Diagnoses   Final diagnoses:  Allergic reaction, initial encounter  Insect bite of left hand, initial encounter  Cellulitis of left hand     Discharge Instructions     Based on your history and physical exam, you have had hornet bite left hand which is causing an allergic reaction left hand.  It is also possible that you have a mild infection from the bite. Today in urgent care, or giving you a "cortisone shot" of Depo-Medrol. Prescription sent to your pharmacy.  Doxycycline to cover possible infection.  Hydroxyzine, 1 every 8 hours if needed for itch.  Prednisone 20 mg-take twice a day for 5 days. Please read attached instruction sheets on allergic reaction and insect bite. May apply cool compresses to left hand.  Keep left hand elevated. Follow-up with your doctor if not improving in 1 week, sooner if worse or new symptoms.    Precautions discussed. Red flags discussed. Questions invited and answered. Patient voiced understanding and agreement.  ED Prescriptions    Medication Sig Dispense Auth. Provider   predniSONE (DELTASONE) 20 MG tablet Take 1 tablet (20 mg total) by mouth 2 (two) times daily with a meal. X 5 days 10 tablet Lajean Manes, MD   doxycycline (VIBRAMYCIN) 100 MG capsule Take 1 capsule (100 mg total) by mouth 2 (two) times daily. For 7 days.  (This is an antibiotic to cover possible infection) 14 capsule Lajean Manes, MD   hydrOXYzine (ATARAX/VISTARIL) 10 MG tablet Take 1 tablet (10 mg total)  by mouth every 8 (eight) hours as needed for itching. Caution: May cause drowsiness 15 tablet Lajean Manes, MD     PDMP not reviewed this encounter.   Lajean Manes, MD 03/23/20 1438

## 2020-03-21 ENCOUNTER — Encounter: Payer: Self-pay | Admitting: Sports Medicine

## 2020-03-21 ENCOUNTER — Ambulatory Visit (INDEPENDENT_AMBULATORY_CARE_PROVIDER_SITE_OTHER): Payer: Medicare PPO

## 2020-03-21 ENCOUNTER — Ambulatory Visit (INDEPENDENT_AMBULATORY_CARE_PROVIDER_SITE_OTHER): Payer: Medicare PPO | Admitting: Sports Medicine

## 2020-03-21 DIAGNOSIS — M5416 Radiculopathy, lumbar region: Secondary | ICD-10-CM | POA: Diagnosis not present

## 2020-03-21 DIAGNOSIS — M1712 Unilateral primary osteoarthritis, left knee: Secondary | ICD-10-CM | POA: Diagnosis not present

## 2020-03-21 NOTE — Assessment & Plan Note (Signed)
This is a pleasant 79 year old female, she has been having lumbar radiculitis, she also has some pain behind her left knee. Today we injected her left knee, she was concerned about a meniscal tear, I think that if the knee injection does not work and she continues to have pain behind her knee we can get a knee MRI we will hold off for now.

## 2020-03-21 NOTE — Assessment & Plan Note (Signed)
Mercedes Reid also has pain in her low back with radiation down the left leg, worse with prolonged sitting, we tried prednisone, gabapentin, meloxicam which caused some swelling so she switched to Voltaren. Formal physical therapy. Because she still has some discomfort and weakness we are going to proceed with MRI for interventional planning.

## 2020-03-21 NOTE — Addendum Note (Signed)
Addended by: Annita Brod on: 03/21/2020 10:53 AM   Modules accepted: Orders

## 2020-03-21 NOTE — Progress Notes (Signed)
    Procedures performed today:    Procedure: Real-time Ultrasound Guided injection of the left knee Device: Samsung HS60  Verbal informed consent obtained.  Time-out conducted.  Noted no overlying erythema, induration, or other signs of local infection.  Skin prepped in a sterile fashion.  Local anesthesia: Topical Ethyl chloride.  With sterile technique and under real time ultrasound guidance: 1 cc Kenalog 40, 2 cc lidocaine, 2 cc bupivacaine injected easily Completed without difficulty  Pain immediately resolved suggesting accurate placement of the medication.  Advised to call if fevers/chills, erythema, induration, drainage, or persistent bleeding.  Images permanently stored and available for review in the ultrasound unit.  Impression: Technically successful ultrasound guided injection.  Independent interpretation of notes and tests performed by another provider:   None.  Brief History, Exam, Impression, and Recommendations:    Primary osteoarthritis of left knee This is a pleasant 79 year old female, she has been having lumbar radiculitis, she also has some pain behind her left knee. Today we injected her left knee, she was concerned about a meniscal tear, I think that if the knee injection does not work and she continues to have pain behind her knee we can get a knee MRI we will hold off for now.  Left lumbar radiculitis Archita also has pain in her low back with radiation down the left leg, worse with prolonged sitting, we tried prednisone, gabapentin, meloxicam which caused some swelling so she switched to Voltaren. Formal physical therapy. Because she still has some discomfort and weakness we are going to proceed with MRI for interventional planning.    ___________________________________________ Ihor Austin. Benjamin Stain, M.D., ABFM., CAQSM. Primary Care and Sports Medicine [facility]  Adjunct Instructor of Family Medicine  [employer]

## 2020-03-22 ENCOUNTER — Ambulatory Visit: Payer: Medicare PPO | Admitting: Sports Medicine

## 2020-03-26 ENCOUNTER — Other Ambulatory Visit: Payer: Self-pay

## 2020-03-26 ENCOUNTER — Ambulatory Visit (INDEPENDENT_AMBULATORY_CARE_PROVIDER_SITE_OTHER): Payer: Medicare PPO

## 2020-03-26 ENCOUNTER — Ambulatory Visit: Payer: Medicare PPO

## 2020-03-26 DIAGNOSIS — M5416 Radiculopathy, lumbar region: Secondary | ICD-10-CM

## 2020-03-26 IMAGING — US US EXTREM LOW VENOUS*L*
1 series · 13 of 24 positions shown · non-contrast
Comparison: None.

CLINICAL DATA: Left lower leg pain and swelling



[Series 1: us extrem low venous*left* · 0.07mm/px · 13 of 44 slices shown]
[im 1/44]
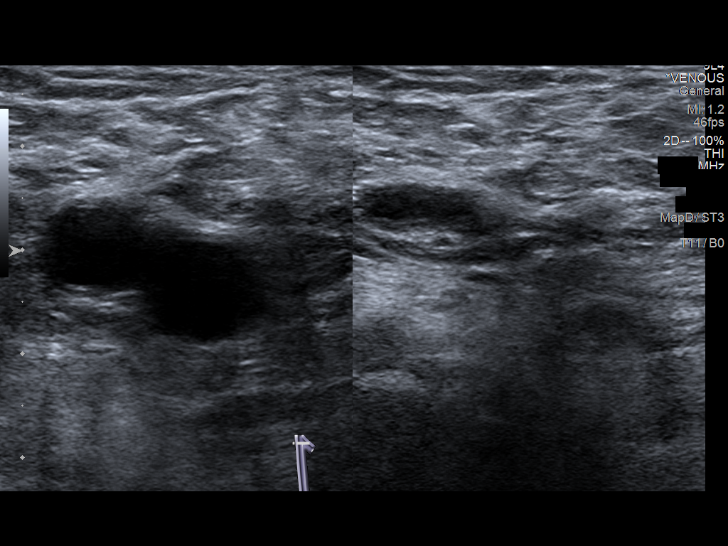
[im 4/44]
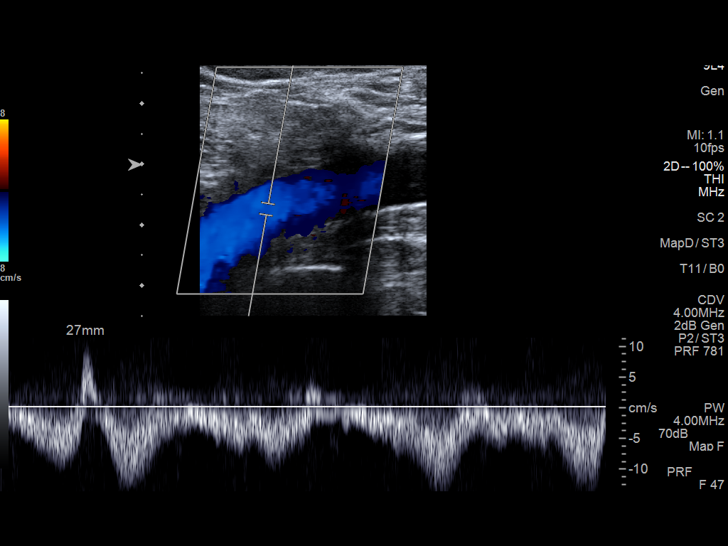
[im 8/44]
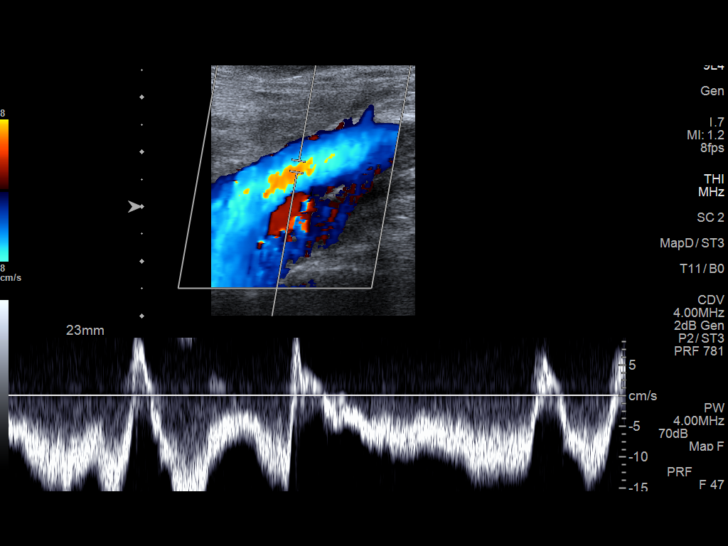
[im 12/44]
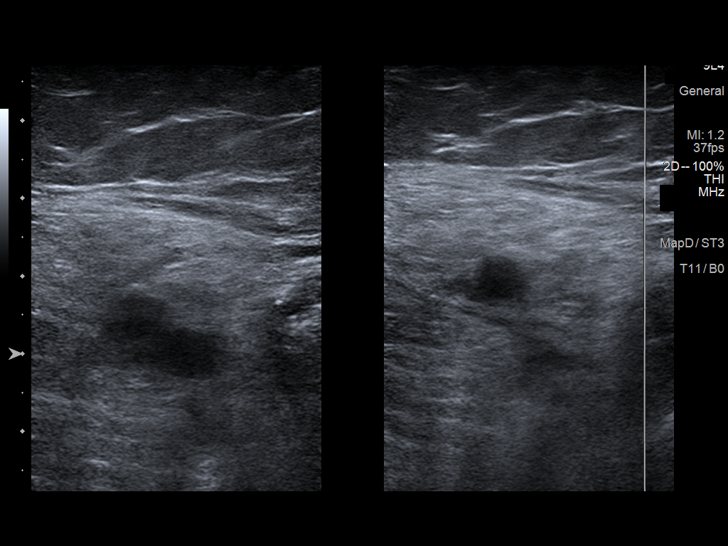
[im 15/44]
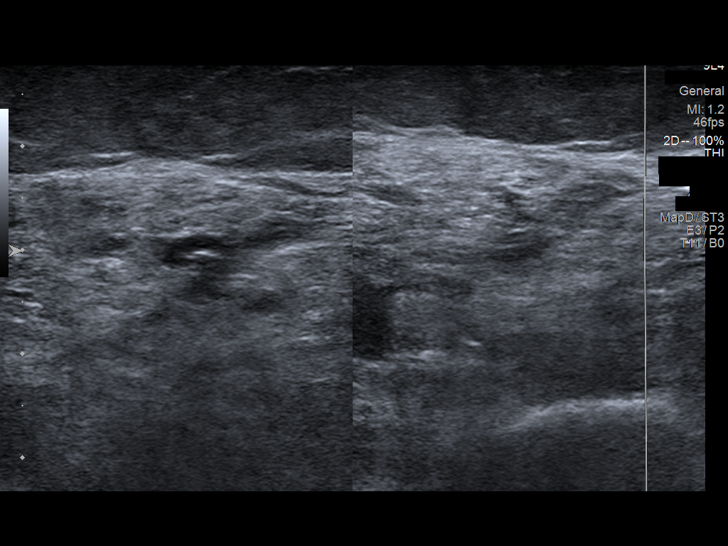
[im 19/44]
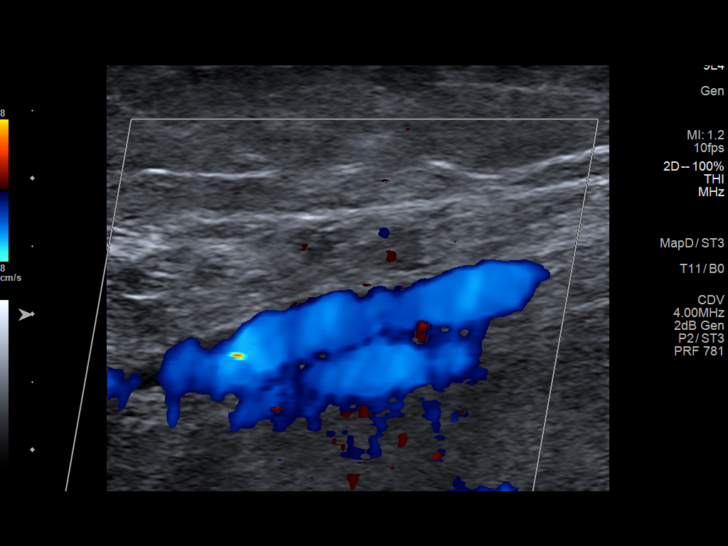
[im 23/44]
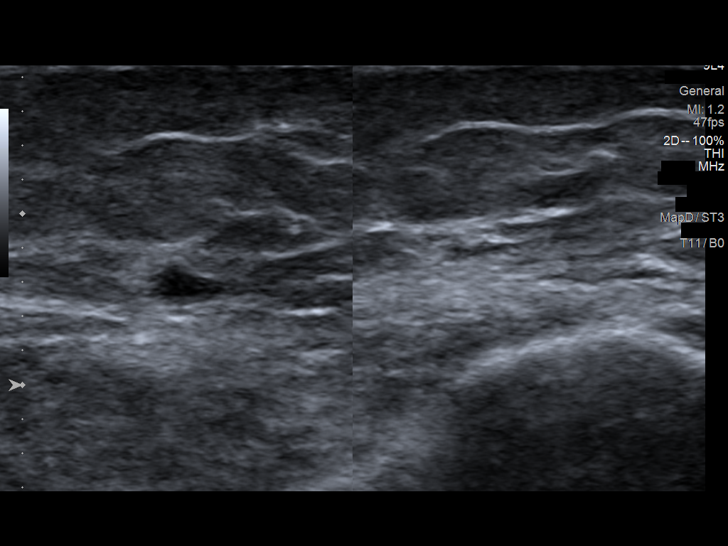
[im 25/44]
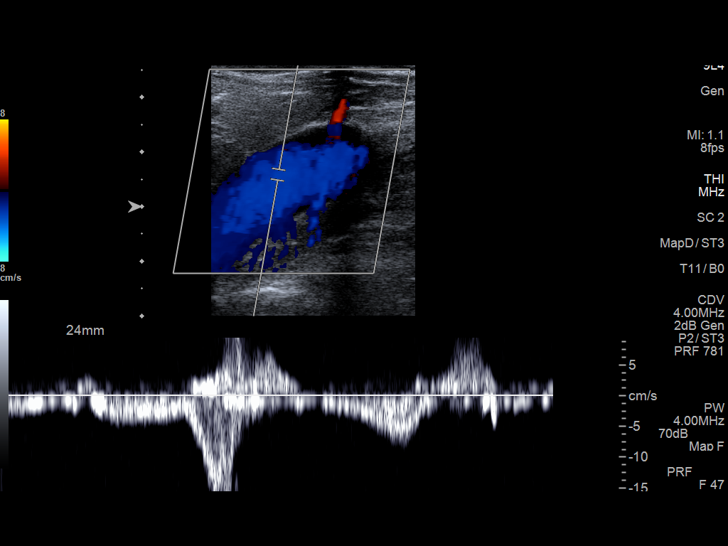
[im 29/44]
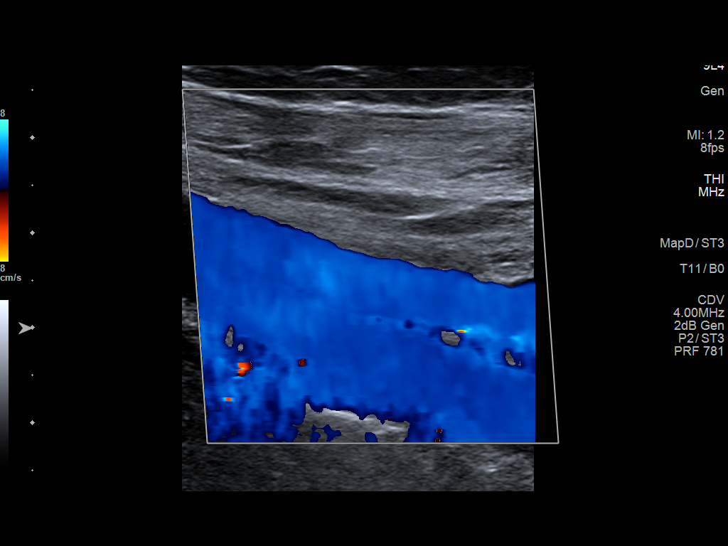
[im 32/44]
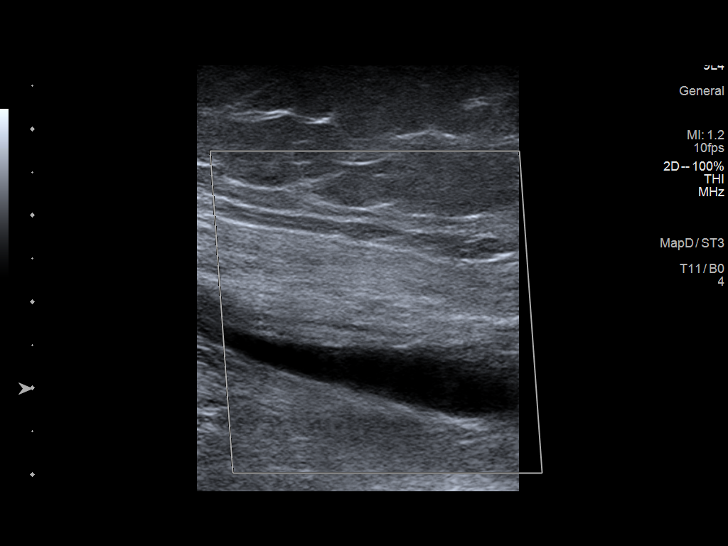
[im 36/44]
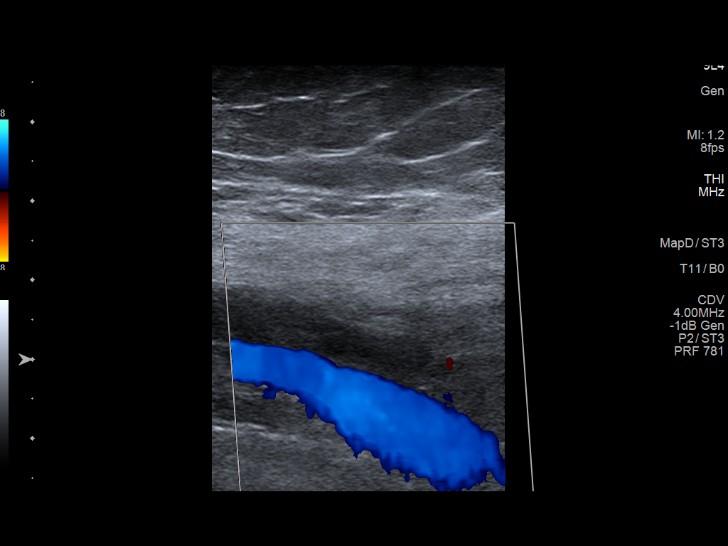
[im 40/44]
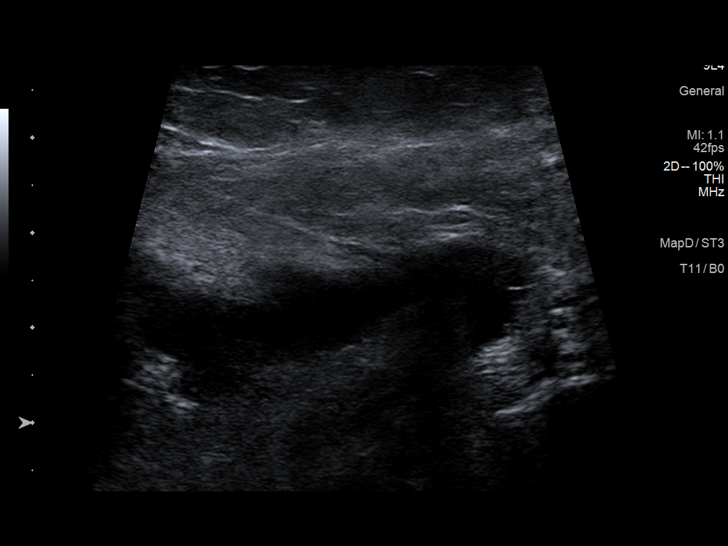
[im 44/44]
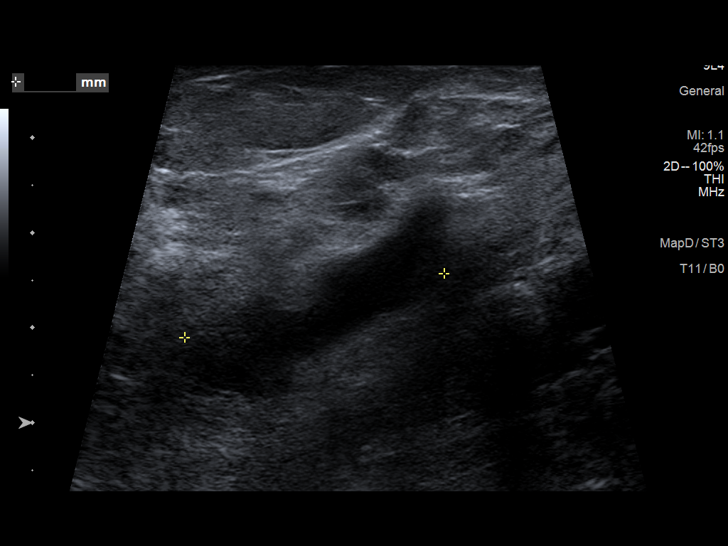

[13 of 24 positions shown; findings below may reference images not displayed]

FINDINGS: Contralateral Common Femoral Vein: Respiratory phasicity is normal
and symmetric with the symptomatic side. No evidence of thrombus.
Normal compressibility.

Common Femoral Vein: No evidence of thrombus. Normal
compressibility, respiratory phasicity and response to augmentation.

Saphenofemoral Junction: No evidence of thrombus. Normal
compressibility and flow on color Doppler imaging.

Profunda Femoral Vein: No evidence of thrombus. Normal
compressibility and flow on color Doppler imaging.

Femoral Vein: No evidence of thrombus. Normal compressibility,
respiratory phasicity and response to augmentation.

Popliteal Vein: No evidence of thrombus. Normal compressibility,
respiratory phasicity and response to augmentation.

Calf Veins: No evidence of thrombus. Normal compressibility and flow
on color Doppler imaging.

Other Findings: Popliteal fossa minimally complex bakers cyst
measures 4.3 x 1.5 x 2.8 cm.
IMPRESSION: No evidence of deep venous thrombosis.

4.3 cm popliteal fossa Baker's cyst.

## 2020-03-26 IMAGING — MR MR LUMBAR SPINE W/O CM
4 of 5 series · 26 of 48 positions shown · non-contrast
Comparison: Lumbar radiographs [DATE].  Lumbar MRI [DATE]

CLINICAL DATA: Lumbar radiculopathy.  Prior back surgery.

EXAM:
MRI LUMBAR SPINE WITHOUT CONTRAST
TECHNIQUE: Multiplanar, multisequence MR imaging of the lumbar spine was
performed. No intravenous contrast was administered.

[Series 2: T2 · sagittal · 4.0mm · 0.81mm/px · 6 of 15 slices shown (1 of 2)]
[im 1/15]
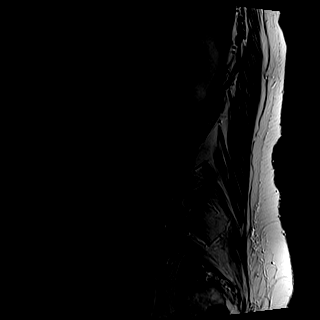
[im 3/15]
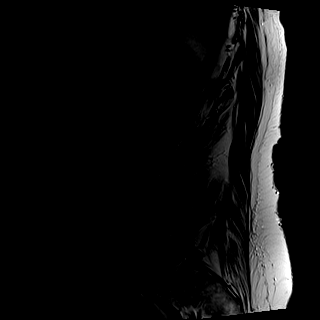
[im 6/15]
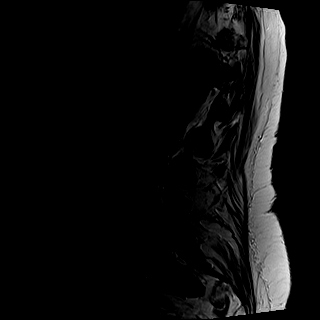
[im 9/15]
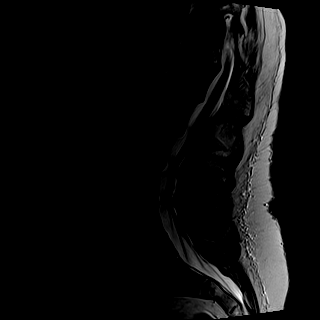
[im 12/15]
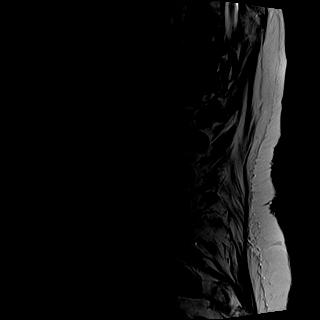
[im 15/15]
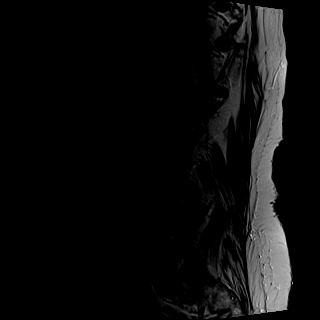

[Series 3: T1 · sagittal · 4.0mm · 0.41mm/px · 6 of 15 slices shown (1 of 2)]
[im 1/15]
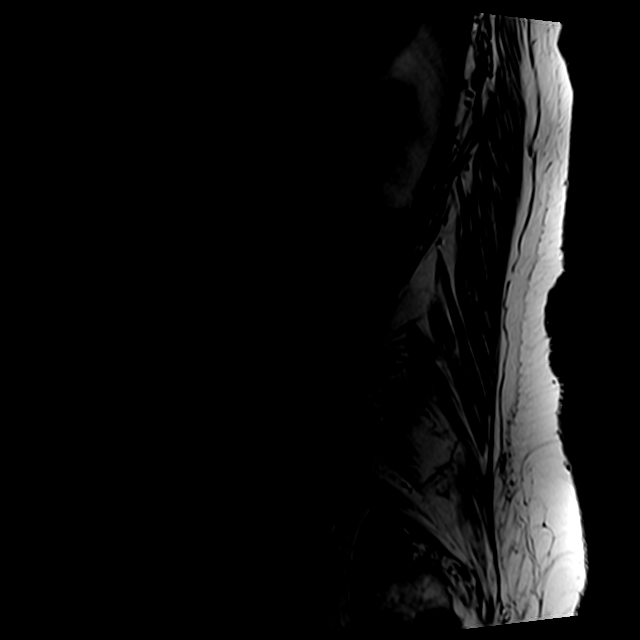
[im 3/15]
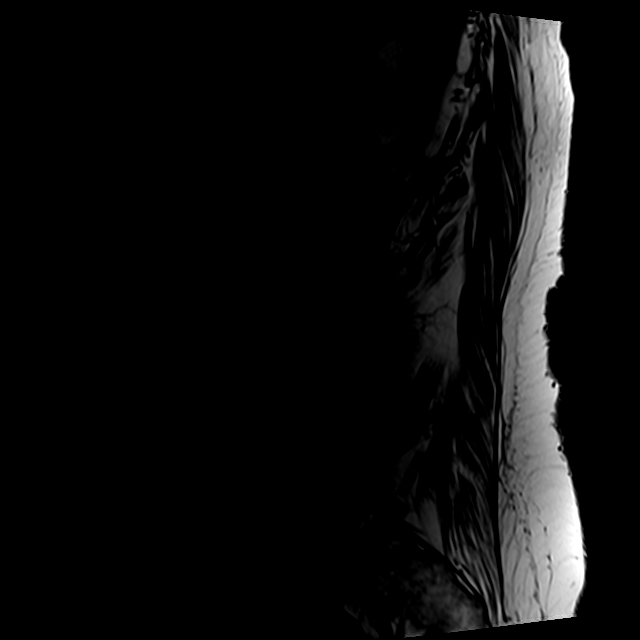
[im 6/15]
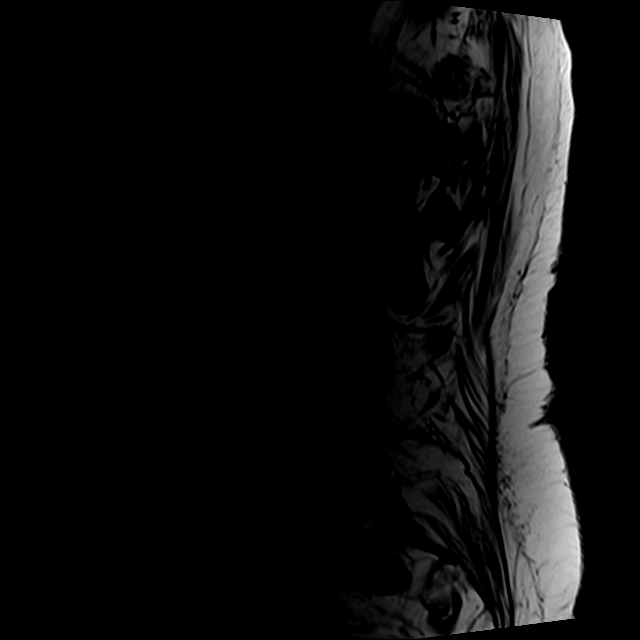
[im 9/15]
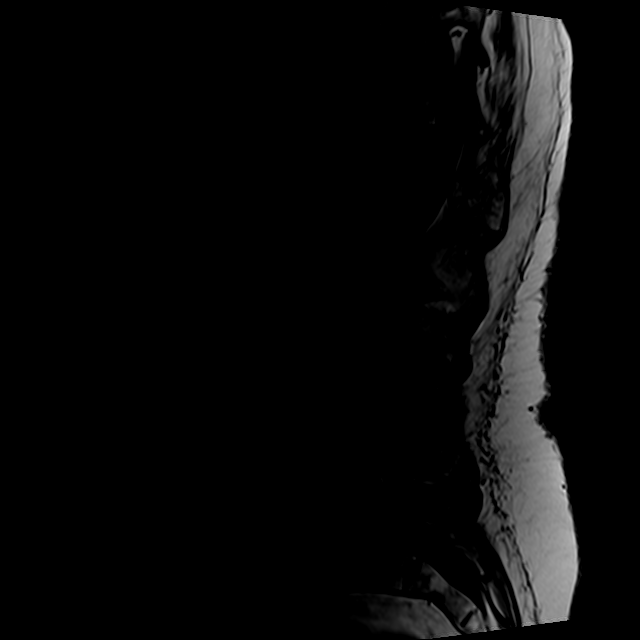
[im 12/15]
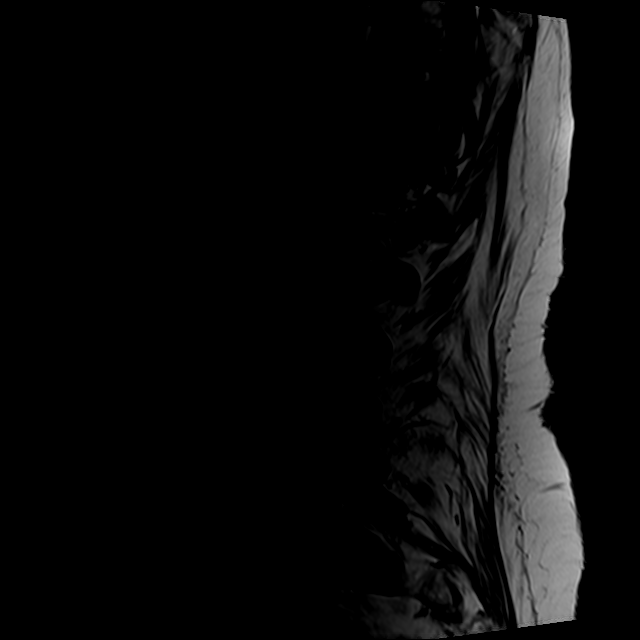
[im 15/15]
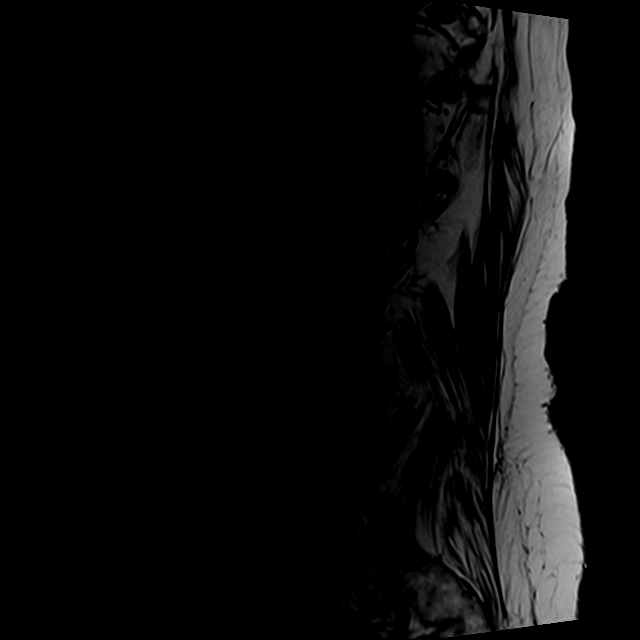

[Series 5: T2 · axial · 4.0mm · 0.78mm/px · z∈[-75,+127]mm · 9 of 37 slices shown (2 of 2)]
[im 1/37]
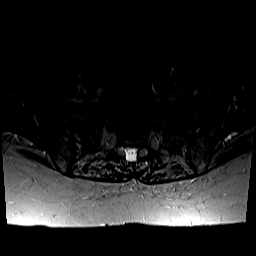
[im 6/37]
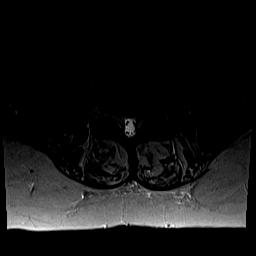
[im 11/37]
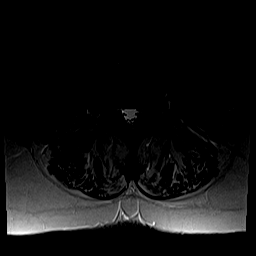
[im 16/37]
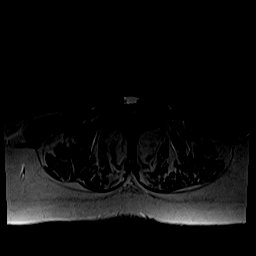
[im 19/37]
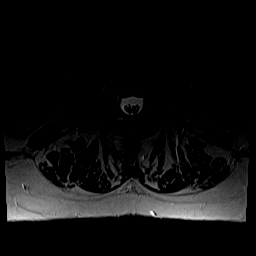
[im 21/37]
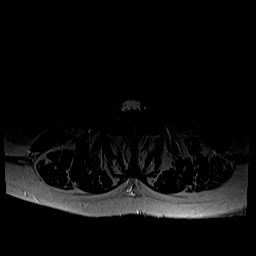
[im 26/37]
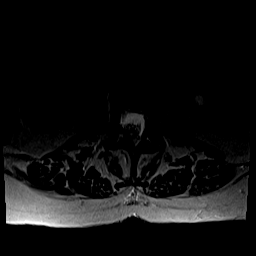
[im 31/37]
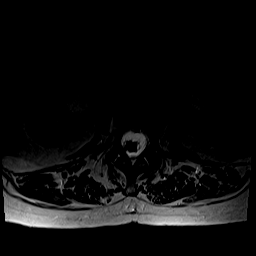
[im 37/37]
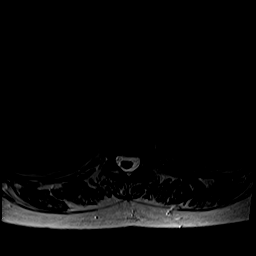

[Series 6: T1 · axial · 4.0mm · 0.39mm/px · z∈[-75,+97]mm · 5 of 37 slices shown (2 of 2)]
[im 1/37]
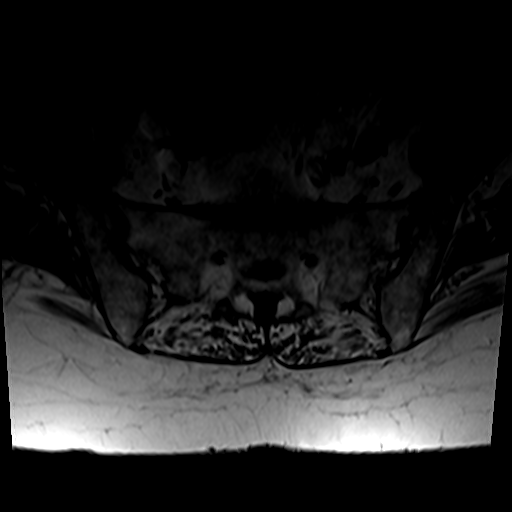
[im 6/37]
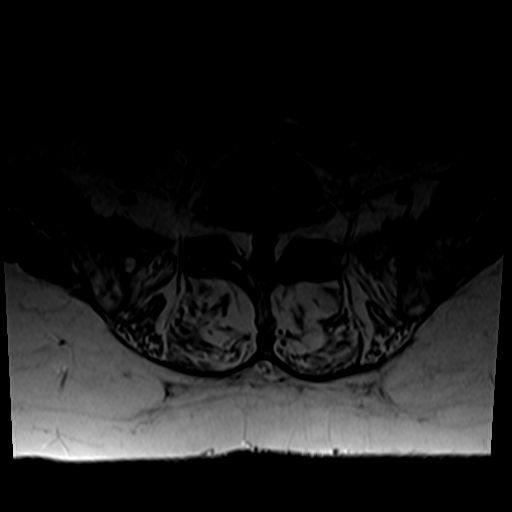
[im 11/37]
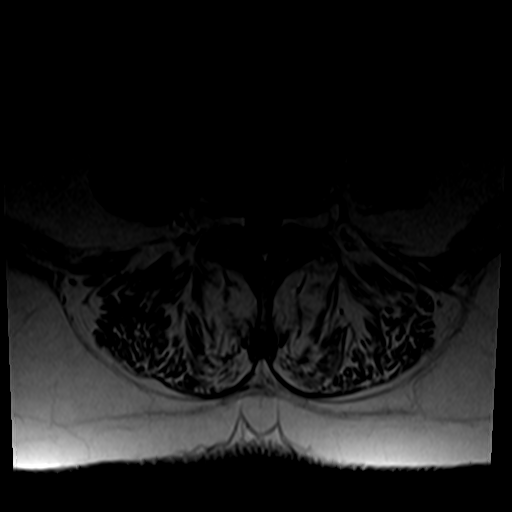
[im 19/37]
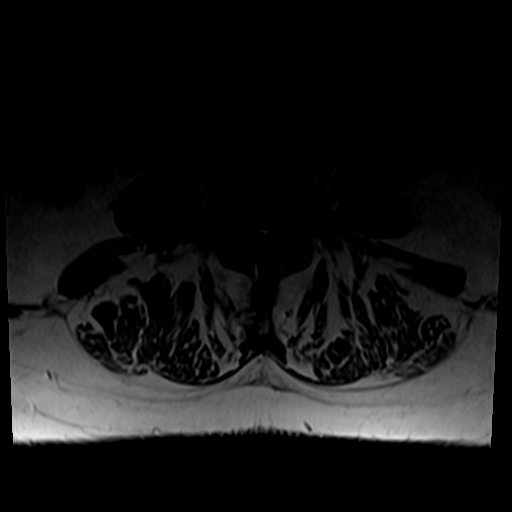
[im 31/37]
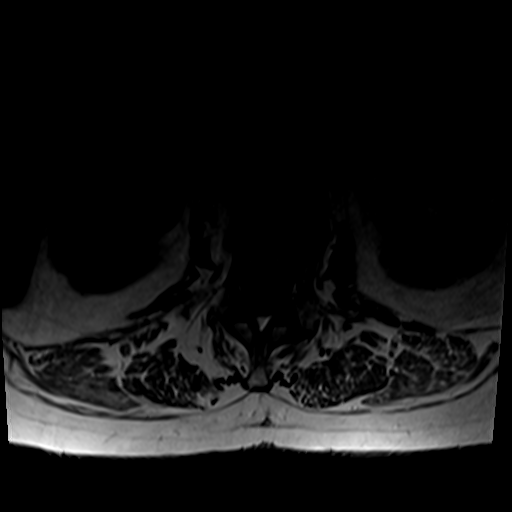

[26 of 48 positions shown; findings below may reference images not displayed]

FINDINGS: Segmentation:  Normal

Alignment: Mild anterolisthesis T11-12. 4 mm retrolisthesis T12-L1
and L1-2 with progression from the prior study. Mild retrolisthesis
L2-3. Slight anterolisthesis L4-5 unchanged.

Vertebrae:  Negative for fracture or mass

Conus medullaris and cauda equina: Conus extends to the L2 level.
Conus and cauda equina appear normal.

Paraspinal and other soft tissues: Negative for paraspinous mass or
adenopathy. No soft tissue edema or fluid collection.

Disc levels:

T12-L1: Disc and facet degeneration with mild retrolisthesis.
Negative for stenosis

L1-2: Disc degeneration with Schmorl's node. Mild facet
degeneration. Negative for stenosis.

L2-3: Disc bulging and mild facet degeneration. Negative for
stenosis.

L3-4: Mild disc and facet degeneration. Mild spinal stenosis. Mild
subarticular stenosis on the left.

L4-5: Mild anterolisthesis. Disc bulging and mild facet
degeneration. Negative for spinal or foraminal stenosis.

L5-S1: Mild disc and facet degeneration.  Negative for stenosis.
IMPRESSION: Multilevel lumbar degenerative changes. Negative for stenosis or
neural impingement.

## 2020-04-17 ENCOUNTER — Ambulatory Visit (INDEPENDENT_AMBULATORY_CARE_PROVIDER_SITE_OTHER): Payer: Medicare PPO

## 2020-04-17 ENCOUNTER — Ambulatory Visit (INDEPENDENT_AMBULATORY_CARE_PROVIDER_SITE_OTHER): Payer: Medicare PPO | Admitting: Sports Medicine

## 2020-04-17 DIAGNOSIS — M1612 Unilateral primary osteoarthritis, left hip: Secondary | ICD-10-CM

## 2020-04-17 NOTE — Assessment & Plan Note (Signed)
This pleasant 79 year old female returns, she had a fantastic response to her left knee injection, she still endorses somewhat of a waddling gait with pain in her left anterior groin. This pain is reproduced with internal rotation. This is all likely due to hip osteoarthritis. We are going to inject her left hip today, her x-rays did show hip osteoarthritis. Hip arthritis rehab exercises given. Return to see me in a month.

## 2020-04-17 NOTE — Progress Notes (Addendum)
    Procedures performed today:    Procedure: Real-time Ultrasound Guided injection of the left hip joint Device: Samsung HS60  Verbal informed consent obtained.  Time-out conducted.  Noted no overlying erythema, induration, or other signs of local infection.  Skin prepped in a sterile fashion.  Local anesthesia: Topical Ethyl chloride.  With sterile technique and under real time ultrasound guidance: 1 cc Kenalog 40, 2 cc lidocaine, 2 cc bupivacaine injected easily Completed without difficulty  Pain immediately resolved suggesting accurate placement of the medication.  Advised to call if fevers/chills, erythema, induration, drainage, or persistent bleeding.  Images permanently stored and available for review in PACS.  Impression: Technically successful ultrasound guided injection.  ___________________________________________ Ihor Austin. Benjamin Stain, M.D., ABFM., CAQSM. Primary Care and Sports Medicine [facility]   Adjunct Instructor of Family Medicine  [employer]  Independent interpretation of notes and tests performed by another provider:   X-rays personally reviewed, she does have joint space narrowing, subchondral sclerosis and subchondral cystic changes in her left hip compared to the right  Brief History, Exam, Impression, and Recommendations:    Primary osteoarthritis of left hip This pleasant 79 year old female returns, she had a fantastic response to her left knee injection, she still endorses somewhat of a waddling gait with pain in her left anterior groin. This pain is reproduced with internal rotation. This is all likely due to hip osteoarthritis. We are going to inject her left hip today, her x-rays did show hip osteoarthritis. Hip arthritis rehab exercises given. Return to see me in a month.    ___________________________________________ Ihor Austin. Benjamin Stain, M.D., ABFM., CAQSM. Primary Care and  Sports Medicine [facility]  Adjunct Instructor of Family Medicine  [employer]

## 2020-05-15 ENCOUNTER — Ambulatory Visit: Payer: Medicare PPO | Admitting: Sports Medicine

## 2020-05-16 ENCOUNTER — Ambulatory Visit (INDEPENDENT_AMBULATORY_CARE_PROVIDER_SITE_OTHER): Payer: Medicare PPO | Admitting: Sports Medicine

## 2020-05-16 DIAGNOSIS — M170 Bilateral primary osteoarthritis of knee: Secondary | ICD-10-CM | POA: Diagnosis not present

## 2020-05-16 DIAGNOSIS — M65321 Trigger finger, right index finger: Secondary | ICD-10-CM

## 2020-05-16 DIAGNOSIS — M1612 Unilateral primary osteoarthritis, left hip: Secondary | ICD-10-CM

## 2020-05-16 NOTE — Progress Notes (Signed)
    Procedures performed today:    None.  Independent interpretation of notes and tests performed by another provider:   None.  Brief History, Exam, Impression, and Recommendations:    Primary osteoarthritis of both knees Mercedes Reid returns, she is a pleasant 79 year old female with some knee pain, we injected her knee back in August and she did really well with regards to pain, unfortunately she still gets occasional buckling when twisting. For this reason we are going to proceed with MRI of her knee and referral to [name].  She is also reporting similar symptoms on the right with locking and buckling, we are going to add an MRI here as well.  Her falls could potentially be life-threatening.  Primary osteoarthritis of left hip Left hip is doing really well after injection at the last visit. She will continue to walk with her rolling walker to avoid falls as this seems to have happened more lately.   Trigger index finger of right hand Avoiding injections for now, I will give her some conditioning exercises for her trigger finger. Return as needed for this.    ___________________________________________ Ihor Austin. Benjamin Stain, M.D., ABFM., CAQSM. Primary Care and Sports Medicine [facility]  Adjunct Instructor of Family Medicine  [employer]

## 2020-05-16 NOTE — Assessment & Plan Note (Signed)
Avoiding injections for now, I will give her some conditioning exercises for her trigger finger. Return as needed for this.

## 2020-05-16 NOTE — Assessment & Plan Note (Addendum)
Mercedes Reid returns, she is a pleasant 79 year old female with some knee pain, we injected her knee back in August and she did really well with regards to pain, unfortunately she still gets occasional buckling when twisting. For this reason we are going to proceed with MRI of her knee and referral to [name].  She is also reporting similar symptoms on the right with locking and buckling, we are going to add an MRI here as well.  Her falls could potentially be life-threatening.

## 2020-05-16 NOTE — Assessment & Plan Note (Signed)
Left hip is doing really well after injection at the last visit. She will continue to walk with her rolling walker to avoid falls as this seems to have happened more lately.

## 2020-05-25 ENCOUNTER — Ambulatory Visit (INDEPENDENT_AMBULATORY_CARE_PROVIDER_SITE_OTHER): Payer: Medicare PPO

## 2020-05-25 ENCOUNTER — Other Ambulatory Visit: Payer: Self-pay

## 2020-05-25 DIAGNOSIS — S83242A Other tear of medial meniscus, current injury, left knee, initial encounter: Secondary | ICD-10-CM

## 2020-05-25 DIAGNOSIS — S83241A Other tear of medial meniscus, current injury, right knee, initial encounter: Secondary | ICD-10-CM | POA: Diagnosis not present

## 2020-05-25 DIAGNOSIS — M7121 Synovial cyst of popliteal space [Baker], right knee: Secondary | ICD-10-CM

## 2020-05-25 DIAGNOSIS — M170 Bilateral primary osteoarthritis of knee: Secondary | ICD-10-CM

## 2020-05-25 DIAGNOSIS — M7122 Synovial cyst of popliteal space [Baker], left knee: Secondary | ICD-10-CM | POA: Diagnosis not present

## 2020-05-25 IMAGING — MR MR KNEE*L* W/O CM
7 series · 40 of 40 positions shown · non-contrast
Comparison: Radiographs [DATE].  No previous MRI.

CLINICAL DATA: Bilateral knee pain with instability. Previous left
knee arthroscopy.

EXAM:
MRI OF THE LEFT KNEE WITHOUT CONTRAST
TECHNIQUE: Multiplanar, multisequence MR imaging of the knee was performed. No
intravenous contrast was administered.

[Series 13: T2 fat-sat · axial · 4.0mm · 0.53mm/px · z∈[-90,+55]mm · 6 of 30 slices shown (1 of 3)]
[im 1/30]
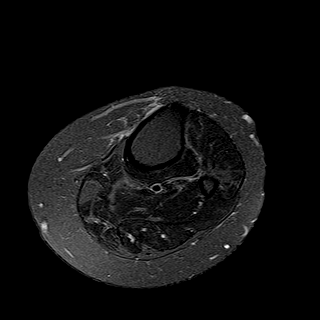
[im 6/30]
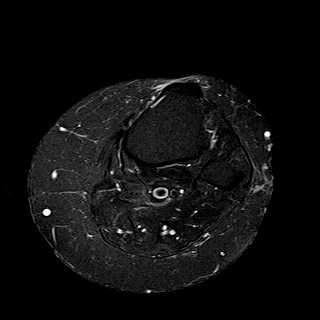
[im 12/30]
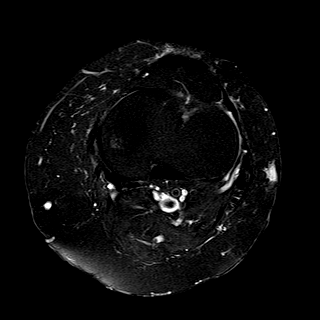
[im 18/30]
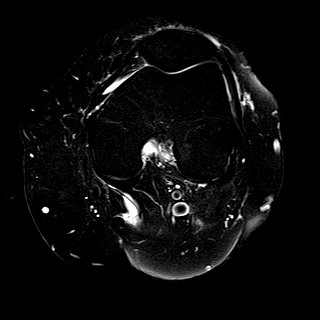
[im 24/30]
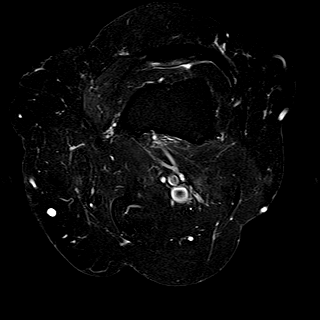
[im 30/30]
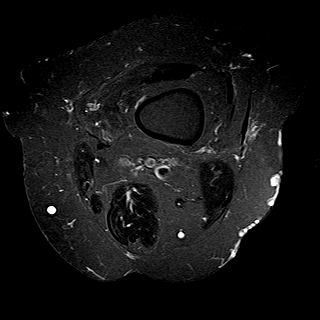

[Series 14: PD fat-sat · sagittal · 3.0mm · 0.62mm/px · 6 of 29 slices shown (1 of 3)]
[im 1/29]
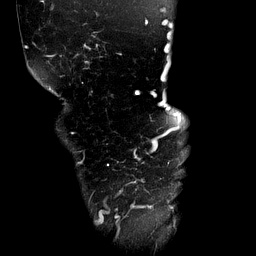
[im 6/29]
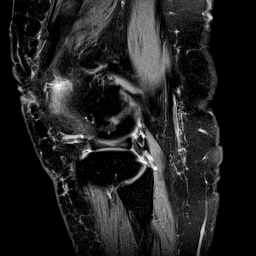
[im 12/29]
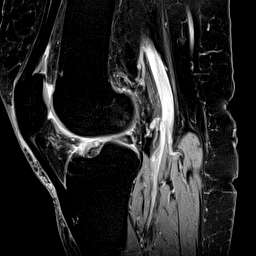
[im 17/29]
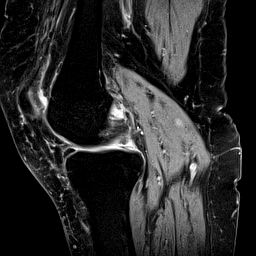
[im 23/29]
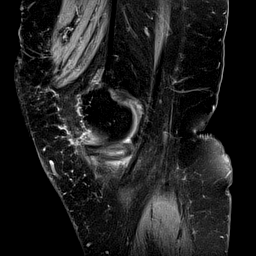
[im 29/29]
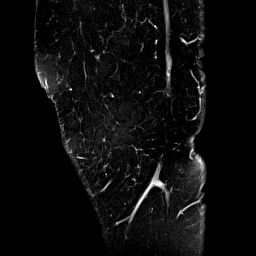

[Series 15: T2 fat-sat · sagittal · 3.0mm · 0.62mm/px · 6 of 29 slices shown (2 of 3)]
[im 1/29]
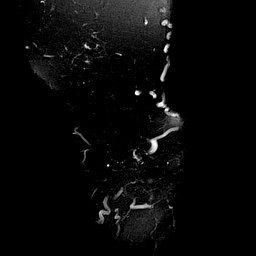
[im 6/29]
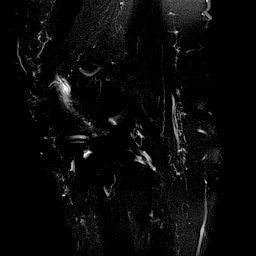
[im 12/29]
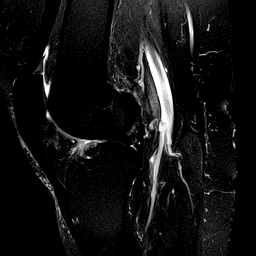
[im 17/29]
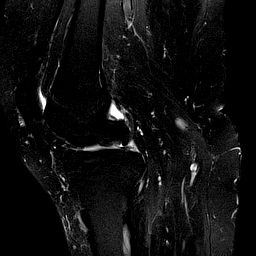
[im 23/29]
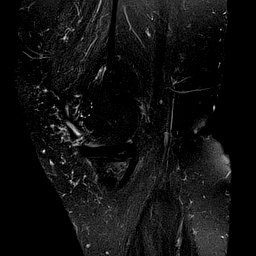
[im 29/29]
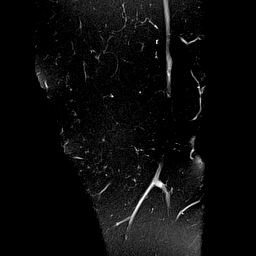

[Series 16: T1 · coronal · 4.0mm · 0.62mm/px · 6 of 30 slices shown]
[im 1/30]
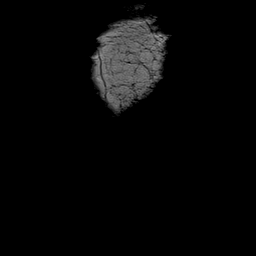
[im 6/30]
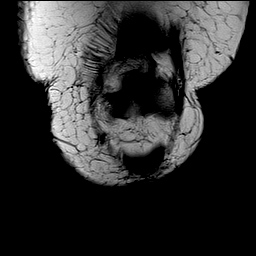
[im 12/30]
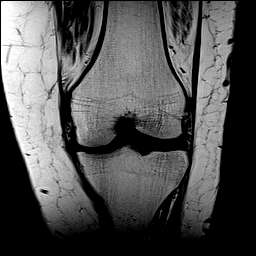
[im 18/30]
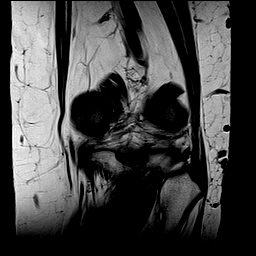
[im 24/30]
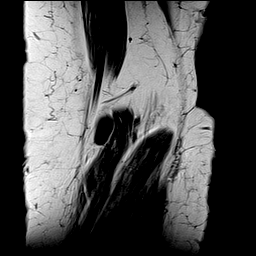
[im 30/30]
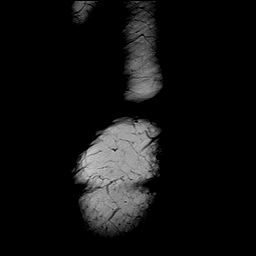

[Series 17: T2 fat-sat · coronal · 4.0mm · 0.62mm/px · 6 of 30 slices shown (3 of 3)]
[im 1/30]
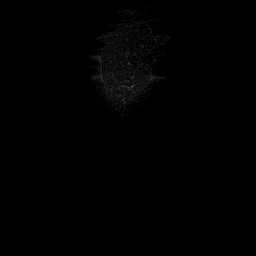
[im 6/30]
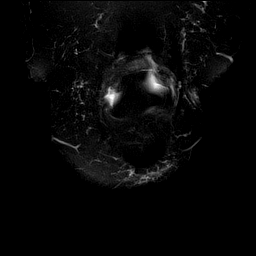
[im 12/30]
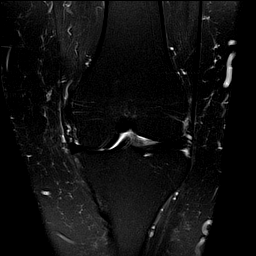
[im 18/30]
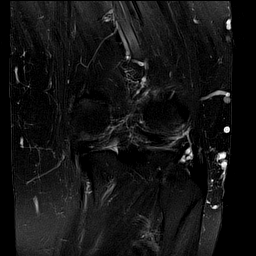
[im 24/30]
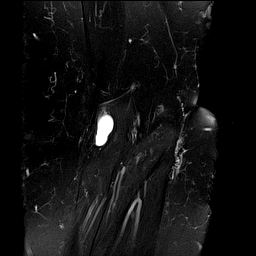
[im 30/30]
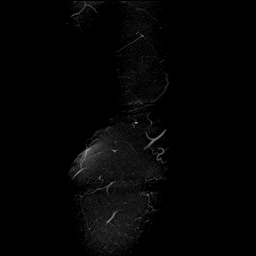

[Series 18: PD fat-sat · coronal · 4.0mm · 0.62mm/px · 6 of 30 slices shown (2 of 3)]
[im 1/30]
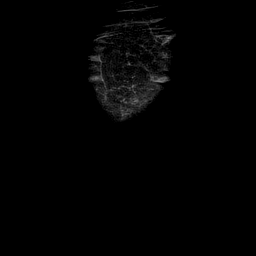
[im 6/30]
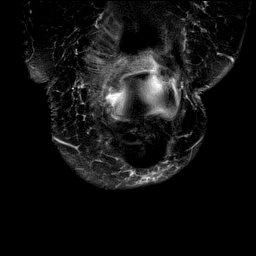
[im 12/30]
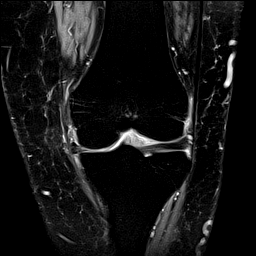
[im 18/30]
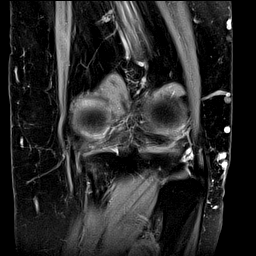
[im 24/30]
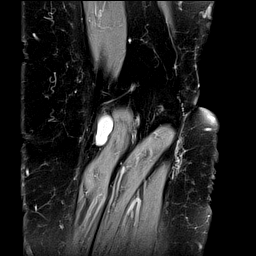
[im 30/30]
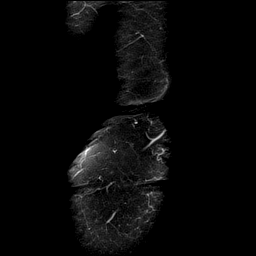

[Series 19: PD fat-sat · coronal · 2.0mm · 0.62mm/px · 4 of 19 slices shown (3 of 3)]
[im 1/19]
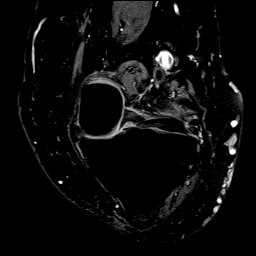
[im 7/19]
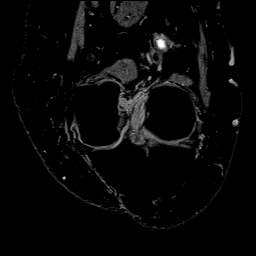
[im 13/19]
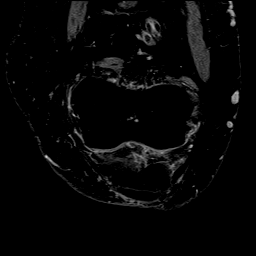
[im 19/19]
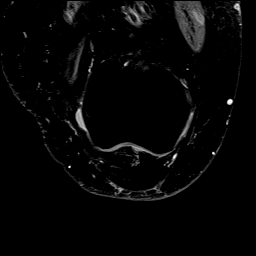

[40 of 40 positions shown; findings below may reference images not displayed]

FINDINGS: MENISCI

Medial meniscus: There is a radial tear at the root of the posterior
horn. The posterior horn is diminutive with articular surface
irregularity which may in part be secondary to previous partial
meniscectomy. There is mild peripheral extrusion of the meniscus
from the joint space. There is a questionable centrally displaced
meniscal fragment which is best seen on coronal image 15/18. This is
not definitive and is not easily differentiated from the PCL on the
axial and sagittal images.

Lateral meniscus:  Intact with normal morphology.

LIGAMENTS

Cruciates:  Intact.  Moderate ACL mucoid degeneration.

Collaterals: Intact. Mild MCL buckling related to the meniscal
extrusion.

CARTILAGE

Patellofemoral:  Preserved.

Medial: Mild diffuse chondral thinning and surface irregularity with
mild subchondral cyst formation centrally in the medial tibial
plateau.

Lateral:  Preserved.

MISCELLANEOUS

Joint:  No significant joint effusion.

Popliteal Fossa:  Small Baker's cyst.

Extensor Mechanism:  Intact.

Bones:  No acute or significant extra-articular osseous findings.

Other: No other significant periarticular soft tissue findings.
IMPRESSION: 1. Radial tear at the root of the posterior horn of the medial
meniscus. Possible centrally displaced meniscal fragment as
described.
2. Moderate ACL mucoid degeneration.
3. Mild medial compartment osteoarthritis.
4. Small Baker's cyst.

## 2020-05-25 IMAGING — MR MR KNEE*R* W/O CM
7 series · 40 of 40 positions shown · non-contrast
Comparison: Radiograph [DATE]

CLINICAL DATA: Bilateral knee pain with instability. No previous
right knee surgery.

EXAM:
MRI OF THE RIGHT KNEE WITHOUT CONTRAST
TECHNIQUE: Multiplanar, multisequence MR imaging of the knee was performed. No
intravenous contrast was administered.

[Series 103: T2 fat-sat · axial · 4.0mm · 0.53mm/px · z∈[-107,+38]mm · 6 of 30 slices shown (1 of 3)]
[im 1/30]
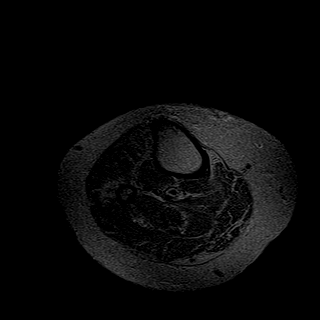
[im 6/30]
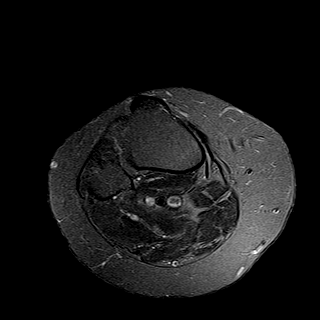
[im 12/30]
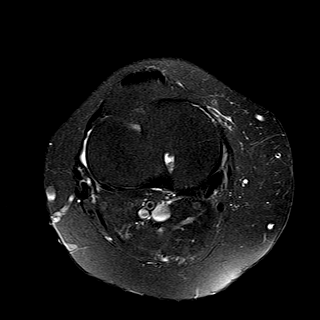
[im 18/30]
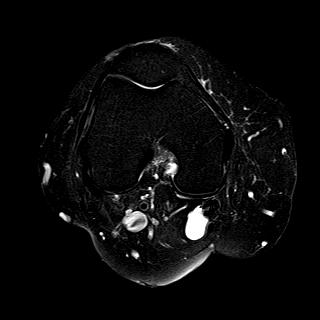
[im 24/30]
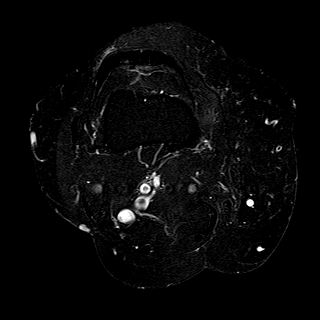
[im 30/30]
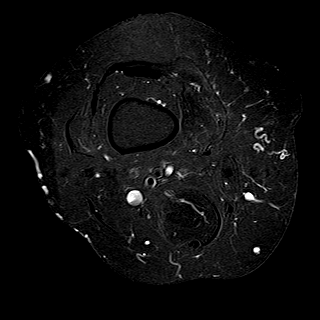

[Series 104: PD fat-sat · sagittal · 3.0mm · 0.62mm/px · 6 of 30 slices shown (1 of 3)]
[im 1/30]
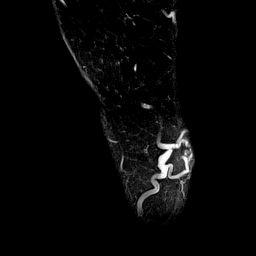
[im 6/30]
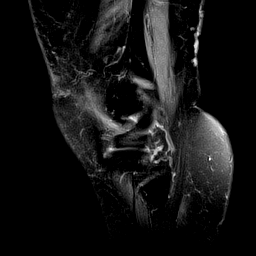
[im 12/30]
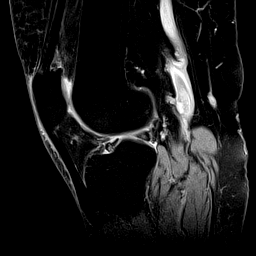
[im 18/30]
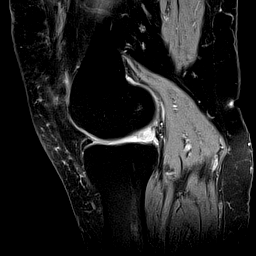
[im 24/30]
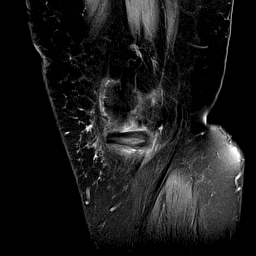
[im 30/30]
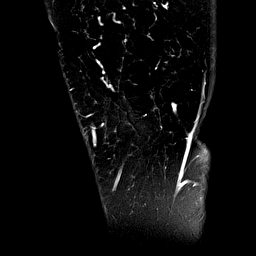

[Series 105: T2 fat-sat · sagittal · 3.0mm · 0.62mm/px · 6 of 30 slices shown (2 of 3)]
[im 1/30]
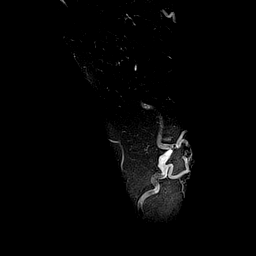
[im 6/30]
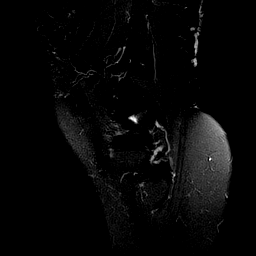
[im 12/30]
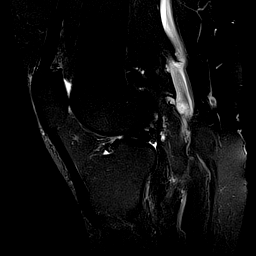
[im 18/30]
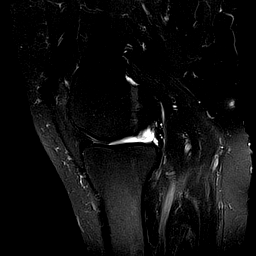
[im 24/30]
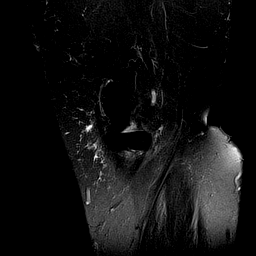
[im 30/30]
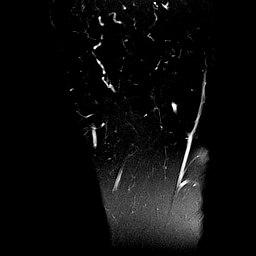

[Series 106: T1 · coronal · 4.0mm · 0.62mm/px · 6 of 29 slices shown]
[im 1/29]
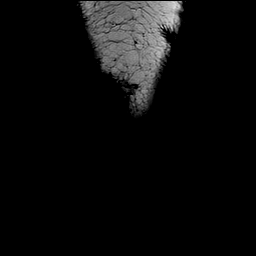
[im 6/29]
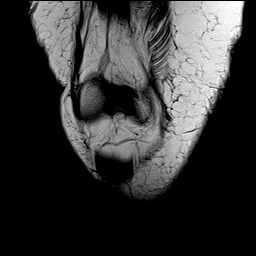
[im 12/29]
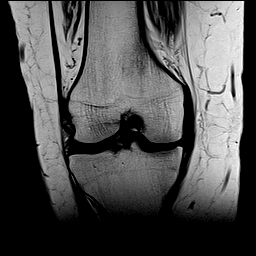
[im 17/29]
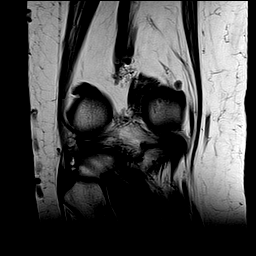
[im 23/29]
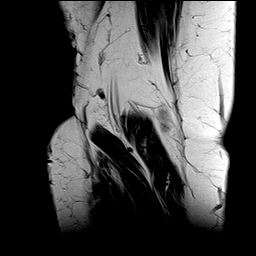
[im 29/29]
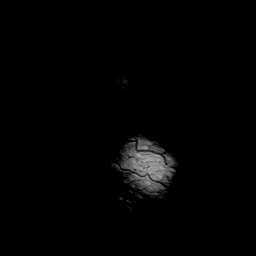

[Series 107: T2 fat-sat · coronal · 4.0mm · 0.62mm/px · 6 of 29 slices shown (3 of 3)]
[im 1/29]
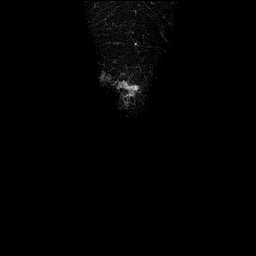
[im 6/29]
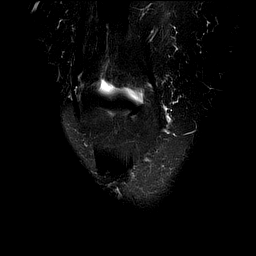
[im 12/29]
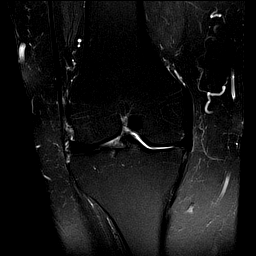
[im 17/29]
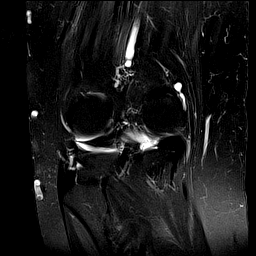
[im 23/29]
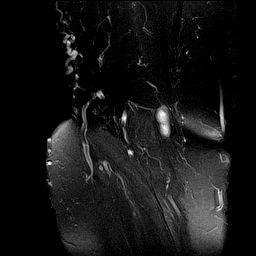
[im 29/29]
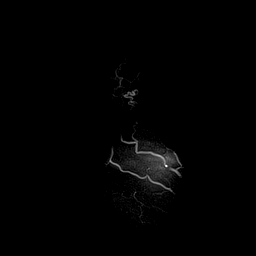

[Series 108: PD fat-sat · coronal · 4.0mm · 0.62mm/px · 6 of 29 slices shown (2 of 3)]
[im 1/29]
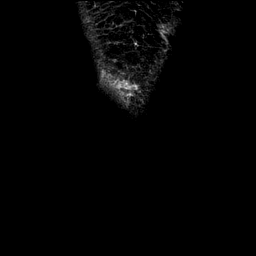
[im 6/29]
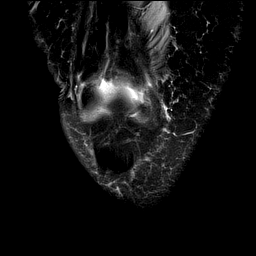
[im 12/29]
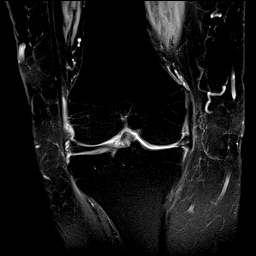
[im 17/29]
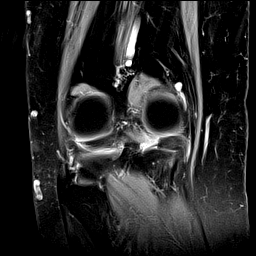
[im 23/29]
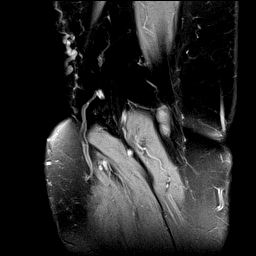
[im 29/29]
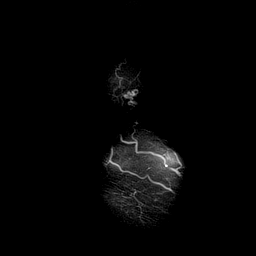

[Series 109: PD fat-sat · coronal · 2.0mm · 0.62mm/px · 4 of 19 slices shown (3 of 3)]
[im 1/19]
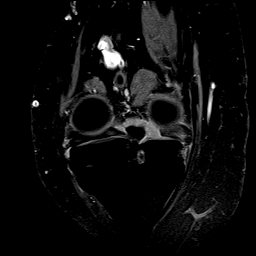
[im 7/19]
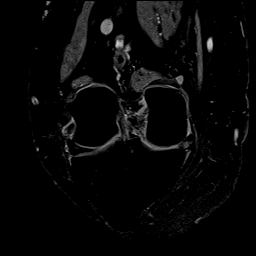
[im 13/19]
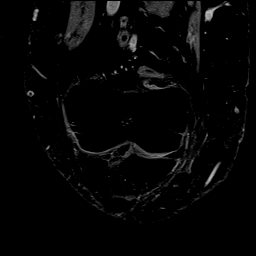
[im 19/19]
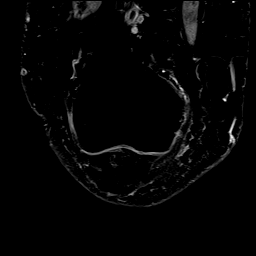

[40 of 40 positions shown; findings below may reference images not displayed]

FINDINGS: MENISCI

Medial meniscus: There is a large radial tear involving the root of
the posterior horn. There is resulting moderate peripheral extrusion
of the meniscus from the joint. There is diffuse degenerative signal
throughout the posterior horn. No centrally displaced meniscal
fragment.

Lateral meniscus:  Intact with normal morphology.

LIGAMENTS

Cruciates:  Intact.

Collaterals: Intact. Mild MCL degenerative buckling related to the
meniscal extrusion.

CARTILAGE

Patellofemoral:  Preserved.

Medial: Mild chondral thinning and surface irregularity without
focal defect.

Lateral:  Preserved.

MISCELLANEOUS

Joint:  No significant joint effusion.

Popliteal Fossa:  Small Baker's cyst.

Extensor Mechanism:  Intact.

Bones: No acute or significant extra-articular osseous findings.
Small intraosseous ganglion posteriorly in the central tibia.

Other: No other significant periarticular soft tissue findings.
IMPRESSION: 1. Large radial tear involving the root of the posterior horn of the
medial meniscus with resulting peripheral extrusion of the meniscus
from the joint.
2. The lateral meniscus, cruciate and collateral ligaments are
intact.
3. Mild medial compartment degenerative changes. No acute osseous
findings.
4. Small Baker's cyst.

## 2020-06-03 ENCOUNTER — Ambulatory Visit (INDEPENDENT_AMBULATORY_CARE_PROVIDER_SITE_OTHER): Payer: Medicare PPO | Admitting: Sports Medicine

## 2020-06-03 DIAGNOSIS — M170 Bilateral primary osteoarthritis of knee: Secondary | ICD-10-CM

## 2020-06-03 NOTE — Assessment & Plan Note (Addendum)
Mercedes Reid returns, she is a pleasant 79 year old female with knee pain, injected back in August, did well but still gets occasional buckling when twisting. We got MRIs of her left and right knees, she does have osteoarthritis and fairly extensive meniscal tearing. [name] had suggested that arthroscopy may be beneficial but considering her unhealthy cartilage she would likely still have some pain. I added an unloader brace today to her left knee unloading the medial compartment, and they are going to think about where to go from here. They are also looking to see if she would be a candidate for knee arthroplasty which would certainly solve her problems. We would consider viscosupplementation but only after the meniscal tearing is addressed with arthroscopy.

## 2020-06-03 NOTE — Progress Notes (Signed)
    Procedures performed today:    None.  Independent interpretation of notes and tests performed by another provider:   None.  Brief History, Exam, Impression, and Recommendations:    Primary osteoarthritis of both knees Mercedes Reid returns, she is a pleasant 79 year old female with knee pain, injected back in August, did well but still gets occasional buckling when twisting. We got MRIs of her left and right knees, she does have osteoarthritis and fairly extensive meniscal tearing. [name] had suggested that arthroscopy may be beneficial but considering her unhealthy cartilage she would likely still have some pain. I added an unloader brace today to her left knee unloading the medial compartment, and they are going to think about where to go from here. They are also looking to see if she would be a candidate for knee arthroplasty which would certainly solve her problems. We would consider viscosupplementation but only after the meniscal tearing is addressed with arthroscopy.     ___________________________________________ Ihor Austin. Benjamin Stain, M.D., ABFM., CAQSM. Primary Care and Sports Medicine [place] [facility]  Adjunct Instructor of Family Medicine  [employer]

## 2020-09-17 ENCOUNTER — Ambulatory Visit: Payer: Medicare PPO | Admitting: Sports Medicine

## 2020-09-17 ENCOUNTER — Ambulatory Visit (INDEPENDENT_AMBULATORY_CARE_PROVIDER_SITE_OTHER): Payer: Medicare PPO

## 2020-09-17 ENCOUNTER — Other Ambulatory Visit: Payer: Self-pay

## 2020-09-17 DIAGNOSIS — M170 Bilateral primary osteoarthritis of knee: Secondary | ICD-10-CM | POA: Diagnosis not present

## 2020-09-17 NOTE — Progress Notes (Signed)
    Procedures performed today:    Procedure: Real-time Ultrasound Guided injection of the left knee Device: Samsung HS60  Verbal informed consent obtained.  Time-out conducted.  Noted no overlying erythema, induration, or other signs of local infection.  Skin prepped in a sterile fashion.  Local anesthesia: Topical Ethyl chloride.  With sterile technique and under real time ultrasound guidance:  No noted effusion, 1 cc Kenalog 40, 2 cc lidocaine, 2 cc bupivacaine injected easily Completed without difficulty  Advised to call if fevers/chills, erythema, induration, drainage, or persistent bleeding.  Images permanently stored and available for review in PACS.  Impression: Technically successful ultrasound guided injection.  Independent interpretation of notes and tests performed by another provider:   None.  Brief History, Exam, Impression, and Recommendations:    Primary osteoarthritis of both knees This pleasant 80 year old female returns, she has known osteoarthritis with extensive meniscal tearing, she has seen [name]. Her last injection in the left knee was in November of last year, recurrence of pain, injected again today, return to see me as needed. We could certainly consider viscosupplementation but this would be best accomplished after the meniscus is addressed with arthroscopy.    ___________________________________________ Ihor Austin. Benjamin Stain, M.D., ABFM., CAQSM. Primary Care and Sports Medicine  [facility]  Adjunct Instructor of Family Medicine  [employer]

## 2020-09-17 NOTE — Assessment & Plan Note (Signed)
This pleasant 80 year old female returns, she has known osteoarthritis with extensive meniscal tearing, she has seen [name]. Her last injection in the left knee was in November of last year, recurrence of pain, injected again today, return to see me as needed. We could certainly consider viscosupplementation but this would be best accomplished after the meniscus is addressed with arthroscopy.

## 2020-10-28 ENCOUNTER — Other Ambulatory Visit: Payer: Self-pay

## 2020-10-28 ENCOUNTER — Ambulatory Visit: Payer: Medicare PPO | Admitting: Sports Medicine

## 2020-10-28 DIAGNOSIS — M5416 Radiculopathy, lumbar region: Secondary | ICD-10-CM | POA: Diagnosis not present

## 2020-10-28 MED ORDER — PREDNISONE 50 MG PO TABS: ORAL_TABLET | ORAL | 0 refills | Status: DC

## 2020-10-28 NOTE — Assessment & Plan Note (Signed)
Recurrence of axial low back pain, without radiation or radicular symptoms, no red flag symptoms. We have treated her in the summertime of last year. Adding a burst of prednisone, updated x-rays and formal physical therapy and [place] per her request. Return to see me in a month, MRI for interventional planning if no better.

## 2020-10-28 NOTE — Progress Notes (Signed)
    Procedures performed today:    None.  Independent interpretation of notes and tests performed by another provider:   None.  Brief History, Exam, Impression, and Recommendations:    Left lumbar radiculitis Recurrence of axial low back pain, without radiation or radicular symptoms, no red flag symptoms. We have treated her in the summertime of last year. Adding a burst of prednisone, updated x-rays and formal physical therapy and [place] per her request. Return to see me in a month, MRI for interventional planning if no better.    ___________________________________________ Ihor Austin. Benjamin Stain, M.D., ABFM., CAQSM. Primary Care and Sports Medicine [place] [facility]  Adjunct Instructor of Family Medicine  [employer]

## 2020-11-25 ENCOUNTER — Ambulatory Visit: Payer: Medicare PPO | Admitting: Sports Medicine

## 2020-11-25 ENCOUNTER — Ambulatory Visit (INDEPENDENT_AMBULATORY_CARE_PROVIDER_SITE_OTHER): Payer: Medicare PPO

## 2020-11-25 ENCOUNTER — Telehealth: Payer: Self-pay | Admitting: Sports Medicine

## 2020-11-25 ENCOUNTER — Other Ambulatory Visit: Payer: Self-pay

## 2020-11-25 DIAGNOSIS — M170 Bilateral primary osteoarthritis of knee: Secondary | ICD-10-CM

## 2020-11-25 DIAGNOSIS — M5416 Radiculopathy, lumbar region: Secondary | ICD-10-CM

## 2020-11-25 NOTE — Assessment & Plan Note (Signed)
Axial low back pain, much better with formal physical therapy and a burst of prednisone, return as needed for this.

## 2020-11-25 NOTE — Telephone Encounter (Signed)
Please work on left knee viscosupplementation approval.  Does not have to be Orthovisc, any brand will do.

## 2020-11-25 NOTE — Assessment & Plan Note (Signed)
Mercedes Reid returns, she is a pleasant 80 year old female, she has known osteoarthritis with extensive meniscal tearing, she saw [name], last injection was in February of this year. We will do a single additional injection, her knee is not a candidate for arthroscopic debridement per [name]. I will also work on getting her approved for viscosupplementation. Return to see me to start Orthovisc when approved.

## 2020-11-25 NOTE — Telephone Encounter (Signed)
I sent through orhtovisc waiting on BID and to see if PA is required. - CF

## 2020-11-25 NOTE — Progress Notes (Signed)
    Procedures performed today:    Procedure: Real-time Ultrasound Guided injection of the left knee Device: Samsung HS60  Verbal informed consent obtained.  Time-out conducted.  Noted no overlying erythema, induration, or other signs of local infection.  Skin prepped in a sterile fashion.  Local anesthesia: Topical Ethyl chloride.  With sterile technique and under real time ultrasound guidance:  Noted mild effusion, 1 cc Kenalog 40, 2 cc lidocaine, 2 cc bupivacaine injected easily Completed without difficulty  Advised to call if fevers/chills, erythema, induration, drainage, or persistent bleeding.  Images permanently stored and available for review in PACS.  Impression: Technically successful ultrasound guided injection.  Independent interpretation of notes and tests performed by another provider:   None.  Brief History, Exam, Impression, and Recommendations:    Primary osteoarthritis of both knees Mercedes Reid returns, she is a pleasant 80 year old female, she has known osteoarthritis with extensive meniscal tearing, she saw [name], last injection was in February of this year. We will do a single additional injection, her knee is not a candidate for arthroscopic debridement per [name]. I will also work on getting her approved for viscosupplementation. Return to see me to start Orthovisc when approved.  Left lumbar radiculitis Axial low back pain, much better with formal physical therapy and a burst of prednisone, return as needed for this.    ___________________________________________ Ihor Austin. Benjamin Stain, M.D., ABFM., CAQSM. Primary Care and Sports Medicine [place] [facility]  Adjunct Instructor of Family Medicine  [employer]

## 2020-12-27 NOTE — Telephone Encounter (Signed)
I sent through [facility] and received Auth [id number] valid 11/28/20 - 02/27/21.   Patient is scheduled starting 01/02/21 and is aware she is responsible for a 35.00 copay at each visit and will receive a bill for what insurance does not cover. - CF

## 2021-01-02 ENCOUNTER — Ambulatory Visit (INDEPENDENT_AMBULATORY_CARE_PROVIDER_SITE_OTHER): Payer: Medicare PPO

## 2021-01-02 ENCOUNTER — Other Ambulatory Visit: Payer: Self-pay

## 2021-01-02 ENCOUNTER — Ambulatory Visit: Payer: Medicare PPO | Admitting: Sports Medicine

## 2021-01-02 DIAGNOSIS — M170 Bilateral primary osteoarthritis of knee: Secondary | ICD-10-CM

## 2021-01-02 NOTE — Progress Notes (Signed)
    Procedures performed today:    Procedure: Real-time Ultrasound Guided injection of the left knee Device: Samsung HS60  Verbal informed consent obtained.  Time-out conducted.  Noted no overlying erythema, induration, or other signs of local infection.  Skin prepped in a sterile fashion.  Local anesthesia: Topical Ethyl chloride.  With sterile technique and under real time ultrasound guidance:  No effusion noted, 30 mg/2 mL of OrthoVisc (sodium hyaluronate) in a prefilled syringe was injected easily into the knee through a 22-gauge needle. Completed without difficulty  Advised to call if fevers/chills, erythema, induration, drainage, or persistent bleeding.  Images permanently stored and available for review in PACS.  Impression: Technically successful ultrasound guided injection.  Independent interpretation of notes and tests performed by another provider:   None.  Brief History, Exam, Impression, and Recommendations:    Primary osteoarthritis of both knees Orthovisc injection #1 of 4 left knee, return in 1 week for #2 of 4. Of note the knee was not a candidate for arthroscopic debridement per [name].    ___________________________________________ Ihor Austin. Benjamin Stain, M.D., ABFM., CAQSM. Primary Care and Sports Medicine [place] [facility]  Adjunct Instructor of Family Medicine  [employer]

## 2021-01-02 NOTE — Assessment & Plan Note (Signed)
Orthovisc injection #1 of 4 left knee, return in 1 week for #2 of 4. Of note the knee was not a candidate for arthroscopic debridement per [name].

## 2021-01-09 ENCOUNTER — Ambulatory Visit (INDEPENDENT_AMBULATORY_CARE_PROVIDER_SITE_OTHER): Payer: Medicare PPO

## 2021-01-09 ENCOUNTER — Ambulatory Visit: Payer: Medicare PPO | Admitting: Sports Medicine

## 2021-01-09 ENCOUNTER — Other Ambulatory Visit: Payer: Self-pay

## 2021-01-09 DIAGNOSIS — M170 Bilateral primary osteoarthritis of knee: Secondary | ICD-10-CM

## 2021-01-09 NOTE — Progress Notes (Signed)
    Procedures performed today:    Procedure: Real-time Ultrasound Guided injection of the left knee Device: Samsung HS60 Verbal informed consent obtained. Time-out conducted. Noted no overlying erythema, induration, or other signs of local infection. Skin prepped in a sterile fashion. Local anesthesia: Topical Ethyl chloride. With sterile technique and under real time ultrasound guidance:  No effusion noted, 30 mg/2 mL of OrthoVisc (sodium hyaluronate) in a prefilled syringe was injected easily into the knee through a 22-gauge needle. Completed without difficulty Advised to call if fevers/chills, erythema, induration, drainage, or persistent bleeding. Images permanently stored and available for review in PACS. Impression: Technically successful ultrasound guided injection.  Independent interpretation of notes and tests performed by another provider:   None.  Brief History, Exam, Impression, and Recommendations:    Primary osteoarthritis of both knees Orthovisc injection #2 of 4 left knee, not a candidate for arthroscopic debridement with [name], return in 1 week for #3 of 4.    ___________________________________________ Ihor Austin. Benjamin Stain, M.D., ABFM., CAQSM. Primary Care and Sports Medicine [place] [facility]  Adjunct Instructor of Family Medicine  [employer]

## 2021-01-09 NOTE — Assessment & Plan Note (Signed)
Orthovisc injection #2 of 4 left knee, not a candidate for arthroscopic debridement with [name], return in 1 week for #3 of 4.

## 2021-01-16 ENCOUNTER — Other Ambulatory Visit: Payer: Self-pay

## 2021-01-16 ENCOUNTER — Ambulatory Visit: Payer: Medicare PPO | Admitting: Sports Medicine

## 2021-01-16 ENCOUNTER — Ambulatory Visit (INDEPENDENT_AMBULATORY_CARE_PROVIDER_SITE_OTHER): Payer: Medicare PPO

## 2021-01-16 DIAGNOSIS — M170 Bilateral primary osteoarthritis of knee: Secondary | ICD-10-CM

## 2021-01-16 NOTE — Assessment & Plan Note (Signed)
Orthovisc injection #3 of 4 left knee, not a candidate for arthroscopic debridement with [name], return to see me in 1 week for Orthovisc No. 4 of 4 left knee.

## 2021-01-16 NOTE — Progress Notes (Signed)
    Procedures performed today:    Procedure: Real-time Ultrasound Guided injection of the left knee Device: Samsung HS60 Verbal informed consent obtained. Time-out conducted. Noted no overlying erythema, induration, or other signs of local infection. Skin prepped in a sterile fashion. Local anesthesia: Topical Ethyl chloride. With sterile technique and under real time ultrasound guidance:  No effusion noted, 30 mg/2 mL of OrthoVisc (sodium hyaluronate) in a prefilled syringe was injected easily into the knee through a 22-gauge needle. Completed without difficulty Advised to call if fevers/chills, erythema, induration, drainage, or persistent bleeding. Images permanently stored and available for review in PACS. Impression: Technically successful ultrasound guided injection.  Independent interpretation of notes and tests performed by another provider:   None.  Brief History, Exam, Impression, and Recommendations:    Primary osteoarthritis of both knees Orthovisc injection #3 of 4 left knee, not a candidate for arthroscopic debridement with [name], return to see me in 1 week for Orthovisc No. 4 of 4 left knee.    ___________________________________________ Ihor Austin. Benjamin Stain, M.D., ABFM., CAQSM. Primary Care and Sports Medicine [place] [facility]  Adjunct Instructor of Family Medicine  [employer]

## 2021-01-20 ENCOUNTER — Encounter: Payer: Self-pay | Admitting: Emergency Medicine

## 2021-01-20 ENCOUNTER — Emergency Department
Admission: EM | Admit: 2021-01-20 | Discharge: 2021-01-20 | Disposition: A | Discharge status: Home / Self Care | Payer: Medicare PPO | Source: Home / Self Care

## 2021-01-20 ENCOUNTER — Other Ambulatory Visit: Payer: Self-pay

## 2021-01-20 DIAGNOSIS — R059 Cough, unspecified: Secondary | ICD-10-CM | POA: Diagnosis not present

## 2021-01-20 DIAGNOSIS — J309 Allergic rhinitis, unspecified: Secondary | ICD-10-CM

## 2021-01-20 MED ORDER — METHYLPREDNISOLONE 4 MG PO TBPK
ORAL_TABLET | ORAL | 0 refills | Status: DC
Start: 2021-01-20 — End: 2021-05-25

## 2021-01-20 MED ORDER — FEXOFENADINE HCL 180 MG PO TABS: 180.0000 mg | ORAL_TABLET | Freq: Every day | ORAL | 0 refills | Status: DC

## 2021-01-20 MED ORDER — BENZONATATE 200 MG PO CAPS: 200.0000 mg | ORAL_CAPSULE | Freq: Three times a day (TID) | ORAL | 0 refills | Status: AC | PRN

## 2021-01-20 MED ORDER — AZITHROMYCIN 250 MG PO TABS
250.0000 mg | ORAL_TABLET | Freq: Every day | ORAL | 0 refills | Status: DC
Start: 2021-01-20 — End: 2021-05-25

## 2021-01-20 NOTE — ED Triage Notes (Signed)
Productive cough and sore throat since Friday  Wheezing noted  Fatigue Denies fever or chills

## 2021-01-20 NOTE — Discharge Instructions (Addendum)
Advised/instructed patient to take medication as directed with food to completion.  Advised patient to take Allegra 180 mg daily for the next 5 days, then as needed.  Advised patient may use Tessalon Perles for cough daily or as needed.

## 2021-01-20 NOTE — ED Provider Notes (Signed)
[name]    CSN: [id number] Arrival date & time: 01/20/21  1055      History   Chief Complaint Chief Complaint  Patient presents with   Sore Throat   Cough    HPI Mercedes Reid is a 80 y.o. female.   HPI 80 year old female presents with productive cough, wheezing, fatigue, and sore throat x 4 days. Denies fever.  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 07/31/2019   Trigger index finger of right hand 06/15/2019   Primary osteoarthritis of both knees 04/07/2018   Trochanteric bursitis of left hip 04/07/2018   Community acquired pneumonia of right middle lobe of lung 08/18/2016   Hypothyroidism (acquired) 06/26/2014   Disc degeneration, lumbosacral 02/25/2014   Fatty liver 02/25/2014   Neuralgia and neuritis 02/25/2014   Osteoarthritis 02/25/2014   Osteopenia 02/25/2014   Anxiety state 10/24/2013   H/O CHF    Thyroid disease    Hypertension    Paroxysmal atrial fibrillation (HCC)    Severe mitral regurgitation    Mitral valve prolapse     Past Surgical History:  Procedure Laterality Date   ABDOMINAL HYSTERECTOMY     COX-MAZE MICROWAVE ABLATION  02/28/2009   Dr. Barry Dienes left side lesion set.   MITRAL VALVE REPAIR  03/01/2011   Quadrangular resection of posterior leaflet with leafet plication and 30-mm SorinMEMO 3D ring annuloplasty)                 OB History   No obstetric history on file.      Home Medications    Prior to Admission medications   Medication Sig Start Date End Date Taking? Authorizing Provider  azithromycin (ZITHROMAX) 250 MG tablet Take 1  tablet (250 mg total) by mouth daily. Take first 2 tablets together, then 1 every day until finished. 01/20/21  Yes Trevor Iha, FNP  benzonatate (TESSALON) 200 MG capsule Take 1 capsule (200 mg total) by mouth 3 (three) times daily as needed for up to 7 days for cough. 01/20/21 01/27/21 Yes Trevor Iha, FNP  fexofenadine [facility] ALLERGY) 180 MG tablet Take 1 tablet (180 mg total) by mouth daily for 15 days. 01/20/21 02/04/21 Yes Trevor Iha, FNP  levothyroxine (SYNTHROID) 100 MCG tablet Take 100 mcg by mouth daily before breakfast.   Yes [provider]  losartan (COZAAR) 100 MG tablet Take 100 mg by mouth daily. 12/20/13  Yes [provider]  methylPREDNISolone (MEDROL DOSEPAK) 4 MG TBPK tablet Take as directed. 01/20/21  Yes Trevor Iha, FNP  metoprolol succinate (TOPROL-XL) 25 MG 24 hr tablet Take 12.5 mg by mouth daily.   Yes [provider]  triamterene-hydrochlorothiazide (DYAZIDE) 37.5-25 MG capsule Take 1 capsule by mouth daily.   Yes [provider]  aspirin 81 MG tablet Take 81 mg by mouth daily.      [provider]  Calcium Carbonate-Vitamin D 600-200 MG-UNIT TABS Take by mouth.    [provider]  estradiol (ESTRACE) 1 MG tablet Take 1 mg by mouth daily.  [provider]  gabapentin (NEURONTIN) 300 MG capsule One tab PO qHS for a week, then BID for a week, then TID. May double weekly to a max of 3,600mg /day Patient not taking: Reported on 01/20/2021 02/09/20   Monica Becton, MD  hydrOXYzine (ATARAX/VISTARIL) 10 MG tablet Take 1 tablet (10 mg total) by mouth every 8 (eight) hours as needed for itching. Caution: May cause drowsiness Patient not taking: Reported on 01/20/2021 03/20/20   Lajean Manes, MD  predniSONE (DELTASONE) 50 MG tablet One tab PO daily for 5 days. Patient not taking: Reported on 01/20/2021 10/28/20   Monica Becton, MD    Family History Family History  Problem Relation Age of Onset    Heart failure Father     Social History Social History   Tobacco Use   Smoking status: Never   Smokeless tobacco: Never  Substance Use Topics   Alcohol use: No   Drug use: No     Allergies   Latex, Other, Sulfa antibiotics, and Amoxicillin-pot clavulanate   Review of Systems Review of Systems  Constitutional:  Positive for fatigue.  HENT:  Positive for sore throat.   Eyes: Negative.   Respiratory:  Positive for cough and wheezing.   Cardiovascular: Negative.   Gastrointestinal: Negative.   Genitourinary: Negative.   Musculoskeletal: Negative.   Skin: Negative.   Neurological: Negative.     Physical Exam Triage Vital Signs ED Triage Vitals  Enc Vitals Group     BP 01/20/21 1107 130/82     Pulse Rate 01/20/21 1107 75     Resp 01/20/21 1107 18     Temp 01/20/21 1107 99 F (37.2 C)     Temp Source 01/20/21 1107 Oral     SpO2 01/20/21 1107 97 %     Weight --      Height --      Head Circumference --      Peak Flow --      Pain Score 01/20/21 1110 0     Pain Loc --      Pain Edu? --      Excl. in GC? --    No data found.  Updated Vital Signs BP 130/82 (BP Location: Right Arm)   Pulse 75   Temp 99 F (37.2 C) (Oral)   Resp 18   SpO2 97%      Physical Exam Constitutional:      Appearance: Normal appearance. She is normal weight.  HENT:     Head: Normocephalic and atraumatic.     Right Ear: Tympanic membrane and ear canal normal.     Left Ear: Tympanic membrane and ear canal normal.     Nose: Congestion present.     Comments: Turbinates are erythematous    Mouth/Throat:     Mouth: Mucous membranes are moist.     Pharynx: Oropharynx is clear. No oropharyngeal exudate or posterior oropharyngeal erythema.     Comments: Moderate amount of clear drainage of posterior oropharynx noted Eyes:     Extraocular Movements: Extraocular movements intact.     Conjunctiva/sclera: Conjunctivae normal.     Pupils: Pupils are equal, round, and reactive to light.   Pulmonary:     Effort: Pulmonary effort is normal. No respiratory distress.     Breath sounds: No stridor. Rhonchi present. No wheezing or rales.     Comments: Decreased air entry at bases frequent productive to nonproductive cough noted on exam Chest:     Chest wall: No  tenderness.  Musculoskeletal:        General: Normal range of motion.  Skin:    General: Skin is warm.  Neurological:     General: No focal deficit present.     Mental Status: She is alert and oriented to person, place, and time.  Psychiatric:        Mood and Affect: Mood normal.        Behavior: Behavior normal.     UC Treatments / Results  Labs (all labs ordered are listed, but only abnormal results are displayed) Labs Reviewed - No data to display  EKG   Radiology No results found.  Procedures Procedures (including critical care time)  Medications Ordered in UC Medications - No data to display  Initial Impression / Assessment and Plan / UC Course  I have reviewed the triage vital signs and the nursing notes.  Pertinent labs & imaging results that were available during my care of the patient were reviewed by me and considered in my medical decision making (see chart for details).    MDM: 1.  Cough, 2.  Allergic rhinitis.  Patient discharged home, hemodynamically stable.  Final Clinical Impressions(s) / UC Diagnoses   Final diagnoses:  Cough  Allergic rhinitis, unspecified seasonality, unspecified trigger     Discharge Instructions      Advised/instructed patient to take medication as directed with food to completion.  Advised patient to take Allegra 180 mg daily for the next 5 days, then as needed.  Advised patient may use Tessalon Perles for cough daily or as needed.     ED Prescriptions     Medication Sig Dispense Auth. Provider   azithromycin (ZITHROMAX) 250 MG tablet Take 1 tablet (250 mg total) by mouth daily. Take first 2 tablets together, then 1 every day until finished. 6  tablet Trevor Iha, FNP   methylPREDNISolone (MEDROL DOSEPAK) 4 MG TBPK tablet Take as directed. 1 each Trevor Iha, FNP   fexofenadine [facility] ALLERGY) 180 MG tablet Take 1 tablet (180 mg total) by mouth daily for 15 days. 15 tablet Trevor Iha, FNP   benzonatate (TESSALON) 200 MG capsule Take 1 capsule (200 mg total) by mouth 3 (three) times daily as needed for up to 7 days for cough. 30 capsule Trevor Iha, FNP      PDMP not reviewed this encounter.   Trevor Iha, FNP 01/20/21 1208

## 2021-01-23 ENCOUNTER — Ambulatory Visit (INDEPENDENT_AMBULATORY_CARE_PROVIDER_SITE_OTHER): Payer: Medicare PPO

## 2021-01-23 ENCOUNTER — Ambulatory Visit: Payer: Medicare PPO | Admitting: Sports Medicine

## 2021-01-23 ENCOUNTER — Other Ambulatory Visit: Payer: Self-pay

## 2021-01-23 ENCOUNTER — Telehealth: Payer: Self-pay | Admitting: Emergency Medicine

## 2021-01-23 DIAGNOSIS — M170 Bilateral primary osteoarthritis of knee: Secondary | ICD-10-CM

## 2021-01-23 MED ORDER — AZITHROMYCIN 250 MG PO TABS
250.0000 mg | ORAL_TABLET | Freq: Every day | ORAL | 0 refills | Status: DC
Start: 2021-01-23 — End: 2021-05-25

## 2021-01-23 NOTE — Telephone Encounter (Signed)
Patient called today, she accidentally threw away 2-250 mgs  Azithromycin tablets. Please refill. Thank you, Angelica Chessman

## 2021-01-23 NOTE — Telephone Encounter (Signed)
Patient accidentally discarded two azithromycin 250mg  tabs.  Will send RX for two tabs.

## 2021-01-23 NOTE — Progress Notes (Signed)
    Procedures performed today:    Procedure: Real-time Ultrasound Guided injection of the left knee Device: Samsung HS60 Verbal informed consent obtained. Time-out conducted. Noted no overlying erythema, induration, or other signs of local infection. Skin prepped in a sterile fashion. Local anesthesia: Topical Ethyl chloride. With sterile technique and under real time ultrasound guidance:  No effusion noted, 30 mg/2 mL of OrthoVisc (sodium hyaluronate) in a prefilled syringe was injected easily into the knee through a 22-gauge needle. Completed without difficulty Advised to call if fevers/chills, erythema, induration, drainage, or persistent bleeding. Images permanently stored and available for review in PACS. Impression: Technically successful ultrasound guided injection.  Independent interpretation of notes and tests performed by another provider:   None.  Brief History, Exam, Impression, and Recommendations:    Primary osteoarthritis of both knees Orthovisc No. 4 of 4 into the left knee, not a candidate for arthroscopic debridement with [name], return to see me in a month.    ___________________________________________ Ihor Austin. Benjamin Stain, M.D., ABFM., CAQSM. Primary Care and Sports Medicine [facility]  Adjunct Instructor of Family Medicine  [employer]

## 2021-01-23 NOTE — Assessment & Plan Note (Signed)
Orthovisc No. 4 of 4 into the left knee, not a candidate for arthroscopic debridement with [name], return to see me in a month.

## 2021-01-24 ENCOUNTER — Telehealth: Payer: Self-pay | Admitting: Emergency Medicine

## 2021-01-24 NOTE — Telephone Encounter (Signed)
Call back to [facility] regarding missing doses of antibiotic- script was sent over on 6/23. Pt confirmed she had picked up medicine and taken dose for today & has last dose for tomorrow. Yittel states she is feeling better and thanked [place] for calling back

## 2021-04-03 ENCOUNTER — Ambulatory Visit: Payer: Medicare PPO | Admitting: Sports Medicine

## 2021-04-03 ENCOUNTER — Ambulatory Visit (INDEPENDENT_AMBULATORY_CARE_PROVIDER_SITE_OTHER): Payer: Medicare PPO

## 2021-04-03 ENCOUNTER — Other Ambulatory Visit: Payer: Self-pay

## 2021-04-03 DIAGNOSIS — M7062 Trochanteric bursitis, left hip: Secondary | ICD-10-CM

## 2021-04-03 IMAGING — DX DG HIP (WITH OR WITHOUT PELVIS) 2-3V*L*
3 series · 3 of 3 positions shown · non-contrast
Comparison: None.

CLINICAL DATA: Left lateral hip pain

EXAM:
DG HIP (WITH OR WITHOUT PELVIS) 2-3V LEFT

[pelvis ap]
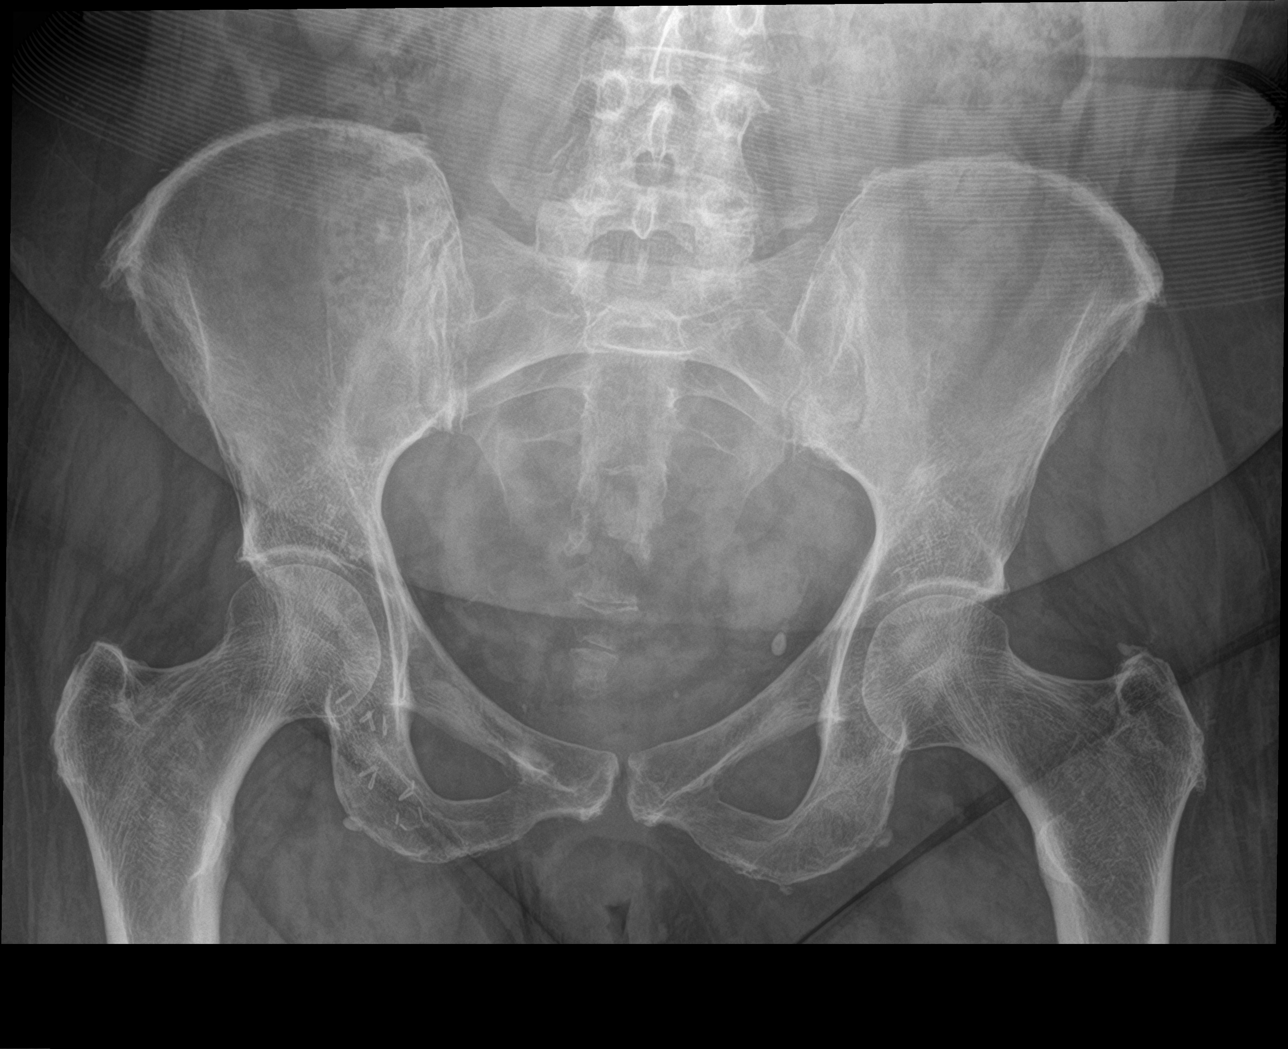

[hip ap]
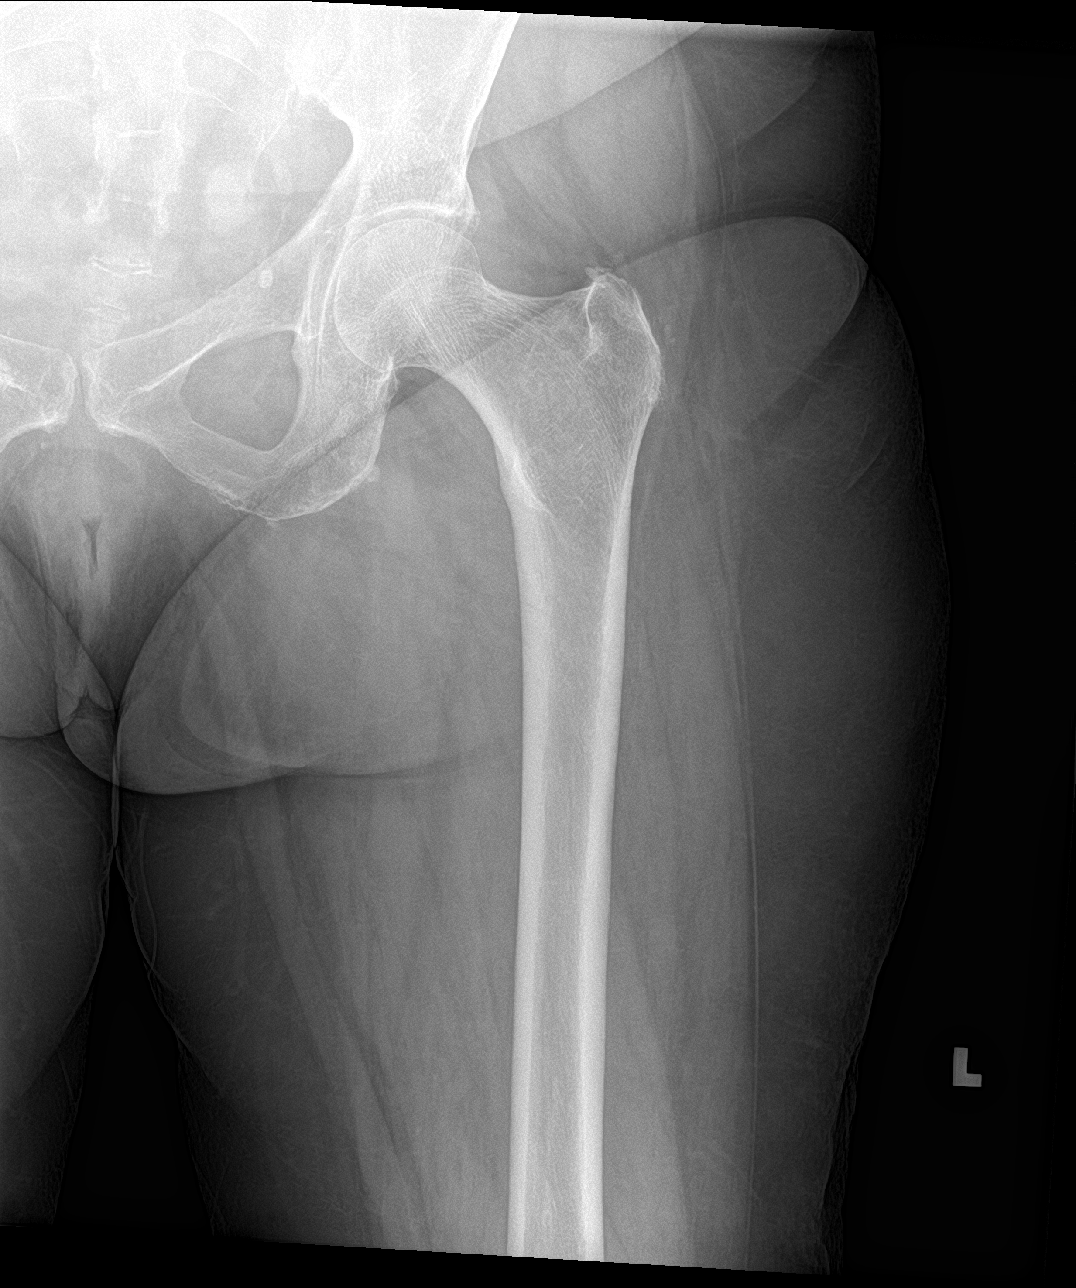

[hip lat]
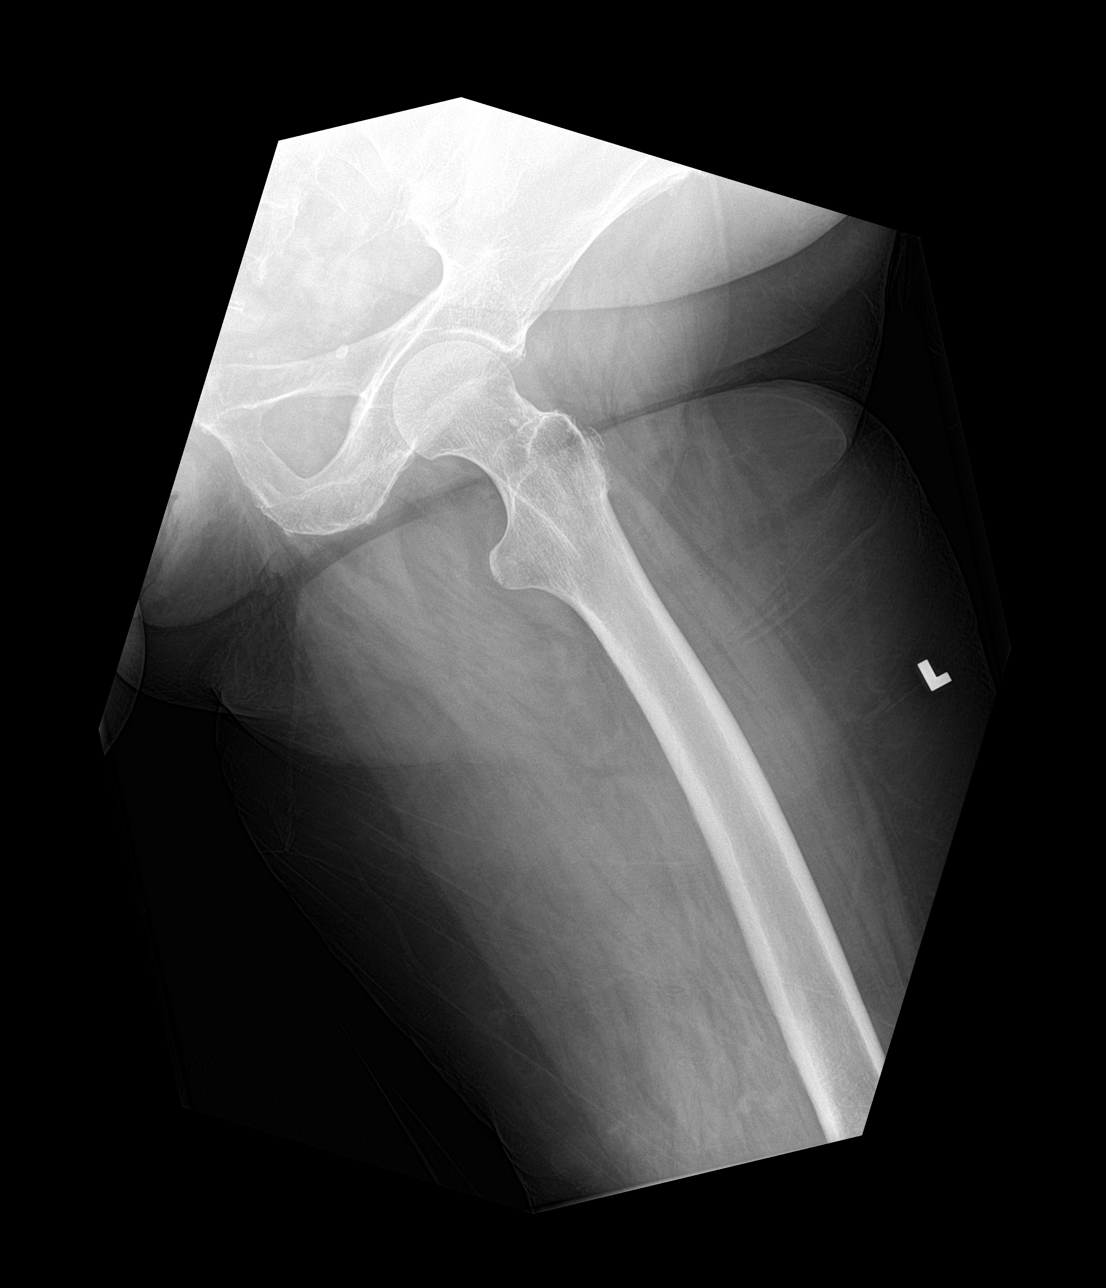

[3 of 3 positions shown; findings below may reference images not displayed]

FINDINGS: There is no evidence of hip fracture or dislocation. There is no
evidence of arthropathy or other focal bone abnormality. Surgical
clips in the right inguinal region.
IMPRESSION: Negative.

## 2021-04-03 NOTE — Assessment & Plan Note (Signed)
Mercedes Reid returns, she is having increasing pain in her left hip, lateral aspect radiation down to the knee, on exam she has relatively weak hip abductor's and tenderness over the greater trochanter directly. She desires aggressive interventional treatment today, today I performed a hip abductor and greater trochanteric bursa injection with ultrasound guidance, we will also add some formal physical therapy for hip abductor strengthening to reduce the chance of recurrence. I would like updated x-rays today. Return to see me in 6 weeks.

## 2021-04-03 NOTE — Progress Notes (Signed)
    Procedures performed today:    Procedure: Real-time Ultrasound Guided injection of the left greater trochanteric bursa Device: Samsung HS60  Verbal informed consent obtained.  Time-out conducted.  Noted no overlying erythema, induration, or other signs of local infection.  Skin prepped in a sterile fashion.  Local anesthesia: Topical Ethyl chloride.  With sterile technique and under real time ultrasound guidance: Mild enthesopathic changes noted, 1 cc Kenalog 40, 2 cc lidocaine, 2 cc bupivacaine injected easily Completed without difficulty  Advised to call if fevers/chills, erythema, induration, drainage, or persistent bleeding.  Images permanently stored and available for review in PACS.  Impression: Technically successful ultrasound guided injection.  Independent interpretation of notes and tests performed by another provider:   None.  Brief History, Exam, Impression, and Recommendations:    Trochanteric bursitis of left hip Mercedes Reid returns, she is having increasing pain in her left hip, lateral aspect radiation down to the knee, on exam she has relatively weak hip abductor's and tenderness over the greater trochanter directly. She desires aggressive interventional treatment today, today I performed a hip abductor and greater trochanteric bursa injection with ultrasound guidance, we will also add some formal physical therapy for hip abductor strengthening to reduce the chance of recurrence. I would like updated x-rays today. Return to see me in 6 weeks.    ___________________________________________ Ihor Austin. Benjamin Stain, M.D., ABFM., CAQSM. Primary Care and Sports Medicine [place] [facility]  Adjunct Instructor of Family Medicine  [employer]

## 2021-04-04 ENCOUNTER — Telehealth: Payer: Self-pay

## 2021-04-04 DIAGNOSIS — M7062 Trochanteric bursitis, left hip: Secondary | ICD-10-CM

## 2021-04-04 NOTE — Telephone Encounter (Signed)
Patient called stating that you wanted her to do PT. She has worked with [facility] in the past with would like to use them again. Their fax number is [phone].

## 2021-04-04 NOTE — Telephone Encounter (Signed)
Orders placed for [facility] physical therapy

## 2021-04-04 NOTE — Telephone Encounter (Signed)
Patient aware referral placed.

## 2021-04-09 ENCOUNTER — Ambulatory Visit: Payer: Medicare PPO | Admitting: Sports Medicine

## 2021-04-13 ENCOUNTER — Emergency Department (INDEPENDENT_AMBULATORY_CARE_PROVIDER_SITE_OTHER)
Admission: EM | Admit: 2021-04-13 | Discharge: 2021-04-13 | Disposition: A | Discharge status: Home / Self Care | Payer: Medicare PPO | Source: Home / Self Care

## 2021-04-13 ENCOUNTER — Encounter: Payer: Self-pay | Admitting: Emergency Medicine

## 2021-04-13 ENCOUNTER — Other Ambulatory Visit: Payer: Self-pay

## 2021-04-13 DIAGNOSIS — M62838 Other muscle spasm: Secondary | ICD-10-CM

## 2021-04-13 MED ORDER — ACETAMINOPHEN 325 MG PO TABS
650.0000 mg | ORAL_TABLET | Freq: Once | ORAL | Status: AC
  Administered 2021-04-13: 650 mg via ORAL

## 2021-04-13 MED ORDER — BACLOFEN 10 MG PO TABS
10.0000 mg | ORAL_TABLET | Freq: Three times a day (TID) | ORAL | 0 refills | Status: DC
Start: 2021-04-13 — End: 2021-11-25

## 2021-04-13 NOTE — Discharge Instructions (Addendum)
Advised patient to take medication as directed.  Encourage patient to avoid activities that involve affected area of neck for the next 7 to 10 days.

## 2021-04-13 NOTE — ED Provider Notes (Signed)
[name]    CSN: [id number] Arrival date & time: 04/13/21  0934      History   Chief Complaint Chief Complaint  Patient presents with   Neck Pain    right    HPI Mercedes Reid is a 80 y.o. female.   HPI 80 year old female presents with right-sided neck pain x1 week.  Reports hurts to turn to the left.  Patient denies injury or insult.  PMH significant for osteoarthritis, osteopenia, neuralgia and neuritis.  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 07/31/2019   Trigger index finger of right hand 06/15/2019   Primary osteoarthritis of both knees 04/07/2018   Trochanteric bursitis of left hip 04/07/2018   Community acquired pneumonia of right middle lobe of lung 08/18/2016   Hypothyroidism (acquired) 06/26/2014   Disc degeneration, lumbosacral 02/25/2014   Fatty liver 02/25/2014   Neuralgia and neuritis 02/25/2014   Osteoarthritis 02/25/2014   Osteopenia 02/25/2014   Anxiety state 10/24/2013   H/O CHF    Thyroid disease    Hypertension    Paroxysmal atrial fibrillation (HCC)    Severe mitral regurgitation    Mitral valve prolapse     Past Surgical History:  Procedure Laterality Date   ABDOMINAL HYSTERECTOMY     COX-MAZE MICROWAVE ABLATION  02/28/2009   Dr. Barry Dienes left side lesion set.   MITRAL VALVE REPAIR  03/01/2011   Quadrangular resection of posterior leaflet with leafet plication and 30-mm SorinMEMO 3D ring annuloplasty)                 OB History   No obstetric history on file.      Home Medications    Prior to Admission medications   Medication Sig  Start Date End Date Taking? Authorizing Provider  baclofen (LIORESAL) 10 MG tablet Take 1 tablet (10 mg total) by mouth 3 (three) times daily. 04/13/21  Yes Trevor Iha, FNP  aspirin 81 MG tablet Take 81 mg by mouth daily.   Patient not taking: Reported on 04/13/2021    [provider]  azithromycin (ZITHROMAX) 250 MG tablet Take 1 tablet (250 mg total) by mouth daily. Take first 2 tablets together, then 1 every day until finished. Patient not taking: Reported on 04/13/2021 01/20/21   Trevor Iha, FNP  azithromycin (ZITHROMAX) 250 MG tablet Take 1 tablet (250 mg total) by mouth daily. Patient not taking: Reported on 04/13/2021 01/23/21   Lattie Haw, MD  Calcium Carbonate-Vitamin D 600-200 MG-UNIT TABS Take by mouth.    [provider]  estradiol (ESTRACE) 1 MG tablet Take 1 mg by mouth daily.      [provider]  fexofenadine (ALLEGRA ALLERGY) 180 MG tablet Take 1 tablet (180 mg total) by mouth daily for 15 days. 01/20/21 02/04/21  Trevor Iha, FNP  gabapentin (NEURONTIN) 300 MG capsule One tab PO qHS for a week, then BID for a week, then TID. May double weekly to a max of 3,600mg /day Patient not taking: Reported on 04/13/2021 02/09/20   Rodney Langton  J, MD  hydrOXYzine (ATARAX/VISTARIL) 10 MG tablet Take 1 tablet (10 mg total) by mouth every 8 (eight) hours as needed for itching. Caution: May cause drowsiness Patient not taking: Reported on 04/13/2021 03/20/20   Lajean Manes, MD  levothyroxine (SYNTHROID) 100 MCG tablet Take 100 mcg by mouth daily before breakfast.    [provider]  losartan (COZAAR) 100 MG tablet Take 100 mg by mouth daily. 12/20/13   [provider]  methylPREDNISolone (MEDROL DOSEPAK) 4 MG TBPK tablet Take as directed. Patient not taking: Reported on 04/13/2021 01/20/21   Trevor Iha, FNP  metoprolol succinate (TOPROL-XL) 25 MG 24 hr tablet Take 12.5 mg by mouth daily.    [provider]  predniSONE (DELTASONE)  50 MG tablet One tab PO daily for 5 days. Patient not taking: Reported on 04/13/2021 10/28/20   Monica Becton, MD  triamterene-hydrochlorothiazide (DYAZIDE) 37.5-25 MG capsule Take 1 capsule by mouth daily.    [provider]    Family History Family History  Problem Relation Age of Onset   Heart failure Father     Social History Social History   Tobacco Use   Smoking status: Never   Smokeless tobacco: Never  Substance Use Topics   Alcohol use: No   Drug use: No     Allergies   Latex, Other, Sulfa antibiotics, and Amoxicillin-pot clavulanate   Review of Systems Review of Systems  Musculoskeletal:  Positive for neck pain.  All other systems reviewed and are negative.   Physical Exam Triage Vital Signs ED Triage Vitals  Enc Vitals Group     BP 04/13/21 0947 138/77     Pulse Rate 04/13/21 0947 63     Resp 04/13/21 0947 16     Temp 04/13/21 0947 98.6 F (37 C)     Temp src --      SpO2 04/13/21 0947 97 %     Weight 04/13/21 0950 165 lb (74.8 kg)     Height 04/13/21 0950 5\' 5"  (1.651 m)     Head Circumference --      Peak Flow --      Pain Score 04/13/21 0949 5     Pain Loc --      Pain Edu? --      Excl. in GC? --    No data found.  Updated Vital Signs BP 138/77 (BP Location: Left Arm)   Pulse 63   Temp 98.6 F (37 C)   Resp 16   Ht 5\' 5"  (1.651 m)   Wt 165 lb (74.8 kg)   SpO2 97%   BMI 27.46 kg/m     Physical Exam Vitals and nursing note reviewed.  Constitutional:      General: She is not in acute distress.    Appearance: Normal appearance. She is normal weight. She is not ill-appearing.  HENT:     Head: Normocephalic and atraumatic.     Nose: Nose normal.     Mouth/Throat:     Mouth: Mucous membranes are moist.     Pharynx: Oropharynx is clear.  Eyes:     Extraocular Movements: Extraocular movements intact.     Conjunctiva/sclera: Conjunctivae normal.     Pupils: Pupils are equal, round, and reactive to light.  Neck:      Comments: Severe limited range of motion with 4 planes of movement, TTP over right-sided superior splenis muscles/right sided superior trapezius muscles, with numerous palpable muscle adhesions noted. Cardiovascular:     Rate and  Rhythm: Normal rate and regular rhythm.     Pulses: Normal pulses.     Heart sounds: Normal heart sounds. No murmur heard. Pulmonary:     Effort: Pulmonary effort is normal.     Breath sounds: Normal breath sounds. No wheezing, rhonchi or rales.  Musculoskeletal:        General: Normal range of motion.     Cervical back: Neck supple.  Skin:    General: Skin is warm and dry.  Neurological:     General: No focal deficit present.     Mental Status: She is alert and oriented to person, place, and time. Mental status is at baseline.  Psychiatric:        Mood and Affect: Mood normal.        Behavior: Behavior normal.        Thought Content: Thought content normal.     UC Treatments / Results  Labs (all labs ordered are listed, but only abnormal results are displayed) Labs Reviewed - No data to display  EKG   Radiology No results found.  Procedures Procedures (including critical care time)  Medications Ordered in UC Medications  acetaminophen (TYLENOL) tablet 650 mg (650 mg Oral Given 04/13/21 0957)    Initial Impression / Assessment and Plan / UC Course  I have reviewed the triage vital signs and the nursing notes.  Pertinent labs & imaging results that were available during my care of the patient were reviewed by me and considered in my medical decision making (see chart for details).     MDM: 1. Trapezius muscle spasm-Rx'd Baclofen; 2.  Muscle spasms of neck-as above noted in PE; Advised patient to take medication as directed.  Encourage patient to avoid activities that involve affected area of neck for the next 7 to 10 days.  Patient discharged home, hemodynamically stable. Final Clinical Impressions(s) / UC Diagnoses   Final diagnoses:   Trapezius muscle spasm  Muscle spasms of neck     Discharge Instructions      Advised patient to take medication as directed.  Encourage patient to avoid activities that involve affected area of neck for the next 7 to 10 days.     ED Prescriptions     Medication Sig Dispense Auth. Provider   baclofen (LIORESAL) 10 MG tablet Take 1 tablet (10 mg total) by mouth 3 (three) times daily. 30 each Trevor Iha, FNP      PDMP not reviewed this encounter.   Trevor Iha, FNP 04/13/21 1116

## 2021-04-13 NOTE — ED Triage Notes (Signed)
Right sided neck pain x 1 week Hurts to turn to left Denies injury  Shoots up from R hip occasionally Heat & Ice did not help No OTC pain meds

## 2021-05-14 ENCOUNTER — Ambulatory Visit: Payer: Medicare PPO | Admitting: Sports Medicine

## 2021-05-14 DIAGNOSIS — M170 Bilateral primary osteoarthritis of knee: Secondary | ICD-10-CM

## 2021-05-14 DIAGNOSIS — M7062 Trochanteric bursitis, left hip: Secondary | ICD-10-CM

## 2021-05-14 MED ORDER — DICLOFENAC SODIUM 1 % EX GEL: 4.0000 g | Freq: Four times a day (QID) | CUTANEOUS | 11 refills | Status: AC

## 2021-05-14 NOTE — Assessment & Plan Note (Signed)
Overall doing well after we did an Orthovisc series that ended in June of this year, she does have a bit of pain with terminal flexion, I explained to her that some discomfort was getting expected. She has used topical Voltaren in the past, calling in some more of this, return as needed.

## 2021-05-14 NOTE — Progress Notes (Signed)
    Procedures performed today:    None.  Independent interpretation of notes and tests performed by another provider:   None.  Brief History, Exam, Impression, and Recommendations:    Primary osteoarthritis of both knees Overall doing well after we did an Orthovisc series that ended in June of this year, she does have a bit of pain with terminal flexion, I explained to her that some discomfort was getting expected. She has used topical Voltaren in the past, calling in some more of this, return as needed.  Trochanteric bursitis of left hip Doing well after greater trochanteric bursa injection at the last visit, formal PT has been tremendously helpful, return to see me as needed for this.  Chronic process not at goal with exacerbation and pharmacologic intervention.  ___________________________________________ Ihor Austin. Benjamin Stain, M.D., ABFM., CAQSM. Primary Care and Sports Medicine [facility] [facility]  Adjunct Instructor of Family Medicine  [employer]

## 2021-05-14 NOTE — Assessment & Plan Note (Signed)
Doing well after greater trochanteric bursa injection at the last visit, formal PT has been tremendously helpful, return to see me as needed for this.

## 2021-05-15 ENCOUNTER — Ambulatory Visit: Payer: Medicare PPO | Admitting: Sports Medicine

## 2021-05-25 ENCOUNTER — Encounter: Payer: Self-pay | Admitting: Emergency Medicine

## 2021-05-25 ENCOUNTER — Emergency Department
Admission: EM | Admit: 2021-05-25 | Discharge: 2021-05-25 | Disposition: A | Discharge status: Home / Self Care | Payer: Medicare PPO | Source: Home / Self Care

## 2021-05-25 ENCOUNTER — Other Ambulatory Visit: Payer: Self-pay

## 2021-05-25 DIAGNOSIS — J309 Allergic rhinitis, unspecified: Secondary | ICD-10-CM | POA: Diagnosis not present

## 2021-05-25 DIAGNOSIS — R059 Cough, unspecified: Secondary | ICD-10-CM | POA: Diagnosis not present

## 2021-05-25 MED ORDER — FEXOFENADINE HCL 180 MG PO TABS
180.0000 mg | ORAL_TABLET | Freq: Every day | ORAL | 0 refills | Status: DC
Start: 2021-05-25 — End: 2021-08-02

## 2021-05-25 MED ORDER — PREDNISONE 20 MG PO TABS
ORAL_TABLET | ORAL | 0 refills | Status: DC
Start: 2021-05-25 — End: 2021-08-02

## 2021-05-25 MED ORDER — BENZONATATE 200 MG PO CAPS: 200.0000 mg | ORAL_CAPSULE | Freq: Three times a day (TID) | ORAL | 0 refills | Status: AC | PRN

## 2021-05-25 NOTE — Discharge Instructions (Addendum)
Advised/Instructed patient to take medication as directed with food to completion.  Advised patient to take Allegra with prednisone for the next 5 days then may use as needed for concurrent postnasal drainage/drip.  Advised patient may take Tessalon Perles daily or as needed for cough.  Encourage patient increase daily water intake while taking these medications.

## 2021-05-25 NOTE — ED Provider Notes (Signed)
[facility]    CSN: [id number] Arrival date & time: 05/25/21  1125      History   Chief Complaint Chief Complaint  Patient presents with   Cough   Nasal Congestion    HPI Mercedes Reid is a 80 y.o. female.   HPI 80 year old female presents with complaints of cough for 2 days.  Patient reports cough, congestion, fatigue, thick music and postnasal drainage.  Currently taking cough medicine and prescribed cefdinir 300 mg every 12 hours for the past 10 days.  Reports last dose of cefdinir was last night at 6:00.  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 07/31/2019   Trigger index finger of right hand 06/15/2019   Primary osteoarthritis of both knees 04/07/2018   Trochanteric bursitis of left hip 04/07/2018   Community acquired pneumonia of right middle lobe of lung 08/18/2016   Hypothyroidism (acquired) 06/26/2014   Disc degeneration, lumbosacral 02/25/2014   Fatty liver 02/25/2014   Neuralgia and neuritis 02/25/2014   Osteoarthritis 02/25/2014   Osteopenia 02/25/2014   Anxiety state 10/24/2013   H/O CHF    Thyroid disease    Hypertension    Paroxysmal atrial fibrillation (HCC)    Severe mitral regurgitation    Mitral valve prolapse     Past Surgical History:  Procedure Laterality Date   ABDOMINAL HYSTERECTOMY     COX-MAZE MICROWAVE ABLATION  02/28/2009   Dr. Barry Dienes left side lesion set.   MITRAL VALVE REPAIR  03/01/2011   Quadrangular resection of posterior leaflet with leafet plication and 30-mm SorinMEMO 3D ring  annuloplasty)                 OB History   No obstetric history on file.      Home Medications    Prior to Admission medications   Medication Sig Start Date End Date Taking? Authorizing Provider  aspirin 81 MG tablet Take 81 mg by mouth daily.   Yes [provider]  benzonatate (TESSALON) 200 MG capsule Take 1 capsule (200 mg total) by mouth 3 (three) times daily as needed for up to 7 days for cough. 05/25/21 06/01/21 Yes Trevor Iha, FNP  Calcium Carbonate-Vitamin D 600-200 MG-UNIT TABS Take by mouth.   Yes [provider]  diclofenac Sodium (VOLTAREN) 1 % GEL Apply 4 g topically 4 (four) times daily. To both knees 05/14/21  Yes Monica Becton, MD  estradiol (ESTRACE) 1 MG tablet Take 1 mg by mouth daily.     Yes [provider]  fexofenadine (ALLEGRA ALLERGY) 180 MG tablet Take 1 tablet (180 mg total) by mouth daily for 15 days. 05/25/21 06/09/21 Yes Trevor Iha, FNP  gabapentin (NEURONTIN) 300 MG capsule One tab PO qHS for a week, then BID for a week, then TID. May double weekly to a max of 3,600mg /day 02/09/20  Yes Monica Becton, MD  levothyroxine (SYNTHROID) 100 MCG tablet Take 100 mcg by mouth daily before breakfast.   Yes [provider]  losartan (COZAAR) 100 MG tablet Take 100 mg by mouth daily. 12/20/13  Yes [provider]  metoprolol succinate (TOPROL-XL) 25 MG 24 hr tablet Take 12.5 mg by mouth daily.   Yes [provider]  predniSONE (DELTASONE) 20 MG tablet Take 3 tabs PO daily x 5 days. 05/25/21  Yes Trevor Iha, FNP  triamterene-hydrochlorothiazide (DYAZIDE) 37.5-25 MG capsule Take 1 capsule by mouth daily.   Yes [provider]  baclofen (LIORESAL) 10 MG tablet Take 1 tablet (10 mg total) by mouth 3 (three) times daily. 04/13/21   Trevor Iha, FNP  hydrOXYzine (ATARAX/VISTARIL) 10 MG tablet Take 1 tablet (10 mg total) by mouth every 8 (eight) hours as needed for itching. Caution: May  cause drowsiness 03/20/20   Lajean Manes, MD    Family History Family History  Problem Relation Age of Onset   Heart failure Father     Social History Social History   Tobacco Use   Smoking status: Never   Smokeless tobacco: Never  Substance Use Topics   Alcohol use: No   Drug use: No     Allergies   Latex, Other, Sulfa antibiotics, and Amoxicillin-pot clavulanate   Review of Systems Review of Systems  HENT:  Positive for congestion and postnasal drip.   Respiratory:  Positive for cough.   All other systems reviewed and are negative.   Physical Exam Triage Vital Signs ED Triage Vitals  Enc Vitals Group     BP 05/25/21 1207 137/83     Pulse Rate 05/25/21 1207 69     Resp 05/25/21 1207 16     Temp 05/25/21 1207 98.4 F (36.9 C)     Temp Source 05/25/21 1207 Oral     SpO2 05/25/21 1207 95 %     Weight --      Height --      Head Circumference --      Peak Flow --      Pain Score 05/25/21 1203 0     Pain Loc --      Pain Edu? --      Excl. in GC? --    No data found.  Updated Vital Signs BP 137/83 (BP Location: Right Arm)   Pulse 69   Temp 98.4 F (36.9 C) (Oral)   Resp 16   SpO2 95%   Physical Exam Vitals and nursing note reviewed.  Constitutional:      Appearance: Normal appearance.  HENT:     Head: Normocephalic and atraumatic.     Right Ear: Tympanic membrane, ear canal and external ear normal.     Left Ear: Tympanic membrane, ear canal and external ear normal.     Mouth/Throat:     Mouth: Mucous membranes are moist.     Pharynx: Oropharynx is clear.     Comments: Moderate amount of clear drainage of posterior oropharynx noted Eyes:     Extraocular Movements: Extraocular movements intact.     Conjunctiva/sclera: Conjunctivae normal.     Pupils: Pupils are equal, round, and reactive to light.  Cardiovascular:     Rate and Rhythm: Normal rate and regular rhythm.     Pulses: Normal pulses.     Heart sounds: Normal heart sounds.   Pulmonary:     Effort: Pulmonary effort is normal.     Breath sounds: Normal breath sounds.  Musculoskeletal:        General: Normal range of motion.     Cervical back: Normal range of motion and  neck supple.  Skin:    General: Skin is warm and dry.  Neurological:     General: No focal deficit present.     Mental Status: She is alert and oriented to person, place, and time. Mental status is at baseline.     UC Treatments / Results  Labs (all labs ordered are listed, but only abnormal results are displayed) Labs Reviewed - No data to display  EKG   Radiology No results found.  Procedures Procedures (including critical care time)  Medications Ordered in UC Medications - No data to display  Initial Impression / Assessment and Plan / UC Course  I have reviewed the triage vital signs and the nursing notes.  Pertinent labs & imaging results that were available during my care of the patient were reviewed by me and considered in my medical decision making (see chart for details).     MDM: 1. Cough-Rx'd Prednisone burst and Tessalon Perles; 2.  Allergic rhinitis-Rx'd Allegra. Advised/Instructed patient to take medication as directed with food to completion.  Advised patient to take Allegra with prednisone for the next 5 days then may use as needed for concurrent postnasal drainage/drip.  Advised patient may take Tessalon Perles daily or as needed for cough.  Encouraged patient increase daily water intake while taking these medications.  Patient discharged home, hemodynamically stable Final Clinical Impressions(s) / UC Diagnoses   Final diagnoses:  Cough, unspecified type  Allergic rhinitis, unspecified seasonality, unspecified trigger     Discharge Instructions      Advised/Instructed patient to take medication as directed with food to completion.  Advised patient to take Allegra with prednisone for the next 5 days then may use as needed for concurrent postnasal  drainage/drip.  Advised patient may take Tessalon Perles daily or as needed for cough.  Encourage patient increase daily water intake while taking these medications.     ED Prescriptions     Medication Sig Dispense Auth. Provider   predniSONE (DELTASONE) 20 MG tablet Take 3 tabs PO daily x 5 days. 15 tablet Trevor Iha, FNP   fexofenadine [facility] ALLERGY) 180 MG tablet Take 1 tablet (180 mg total) by mouth daily for 15 days. 15 tablet Trevor Iha, FNP   benzonatate (TESSALON) 200 MG capsule Take 1 capsule (200 mg total) by mouth 3 (three) times daily as needed for up to 7 days for cough. 40 capsule Trevor Iha, FNP      PDMP not reviewed this encounter.   Trevor Iha, FNP 05/25/21 1314

## 2021-05-25 NOTE — ED Triage Notes (Addendum)
Patient presents to Urgent Care with complaints of cough-productive since 2 days ago. Patient reports cough, congestion, fatigued, thick mucus, postnasal drainage. Taking cough medicine and PCP prescribed Cefdinir 300 mg every 12 hrs for 10 days. Last dose of Cefdinir was last night at 6 pm.

## 2021-08-02 ENCOUNTER — Other Ambulatory Visit: Payer: Self-pay

## 2021-08-02 ENCOUNTER — Emergency Department
Admission: EM | Admit: 2021-08-02 | Discharge: 2021-08-02 | Disposition: A | Discharge status: Home / Self Care | Payer: Medicare PPO | Source: Home / Self Care

## 2021-08-02 ENCOUNTER — Emergency Department (INDEPENDENT_AMBULATORY_CARE_PROVIDER_SITE_OTHER): Payer: Medicare PPO

## 2021-08-02 DIAGNOSIS — R5383 Other fatigue: Secondary | ICD-10-CM | POA: Diagnosis not present

## 2021-08-02 DIAGNOSIS — J309 Allergic rhinitis, unspecified: Secondary | ICD-10-CM | POA: Diagnosis not present

## 2021-08-02 DIAGNOSIS — R0989 Other specified symptoms and signs involving the circulatory and respiratory systems: Secondary | ICD-10-CM

## 2021-08-02 DIAGNOSIS — R059 Cough, unspecified: Secondary | ICD-10-CM

## 2021-08-02 LAB — POC SARS CORONAVIRUS 2 AG -  ED: SARS Coronavirus 2 Ag: NEGATIVE

## 2021-08-02 LAB — POCT INFLUENZA A/B
Influenza A, POC: NEGATIVE
Influenza B, POC: NEGATIVE

## 2021-08-02 IMAGING — DX DG CHEST 2V
2 series · 2 of 2 positions shown · non-contrast
Comparison: Chest x-ray [DATE].

CLINICAL DATA: 80-year-old female with history of cough, congestion
and fatigue.

EXAM:
CHEST - 2 VIEW

[chest pa]
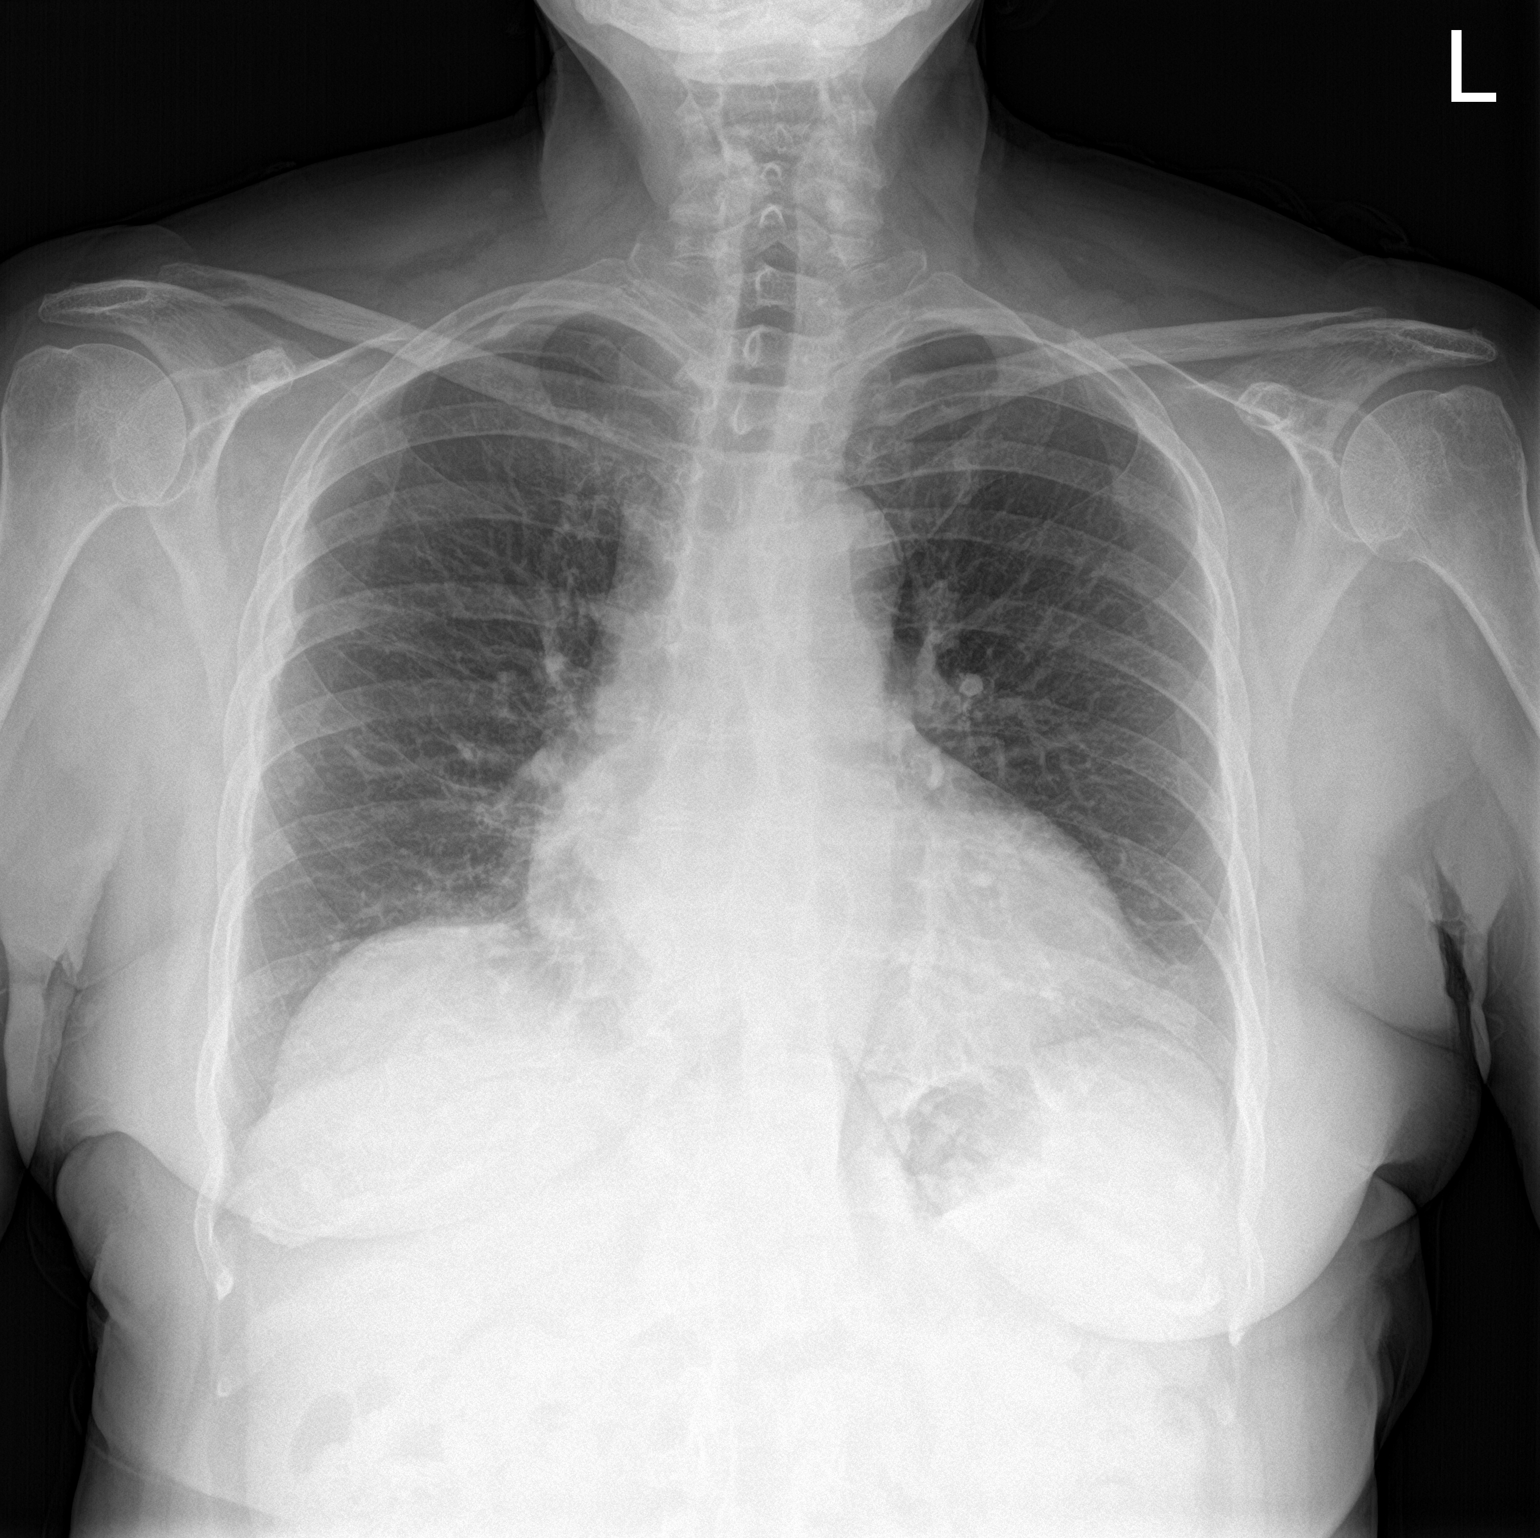

[chest lat]
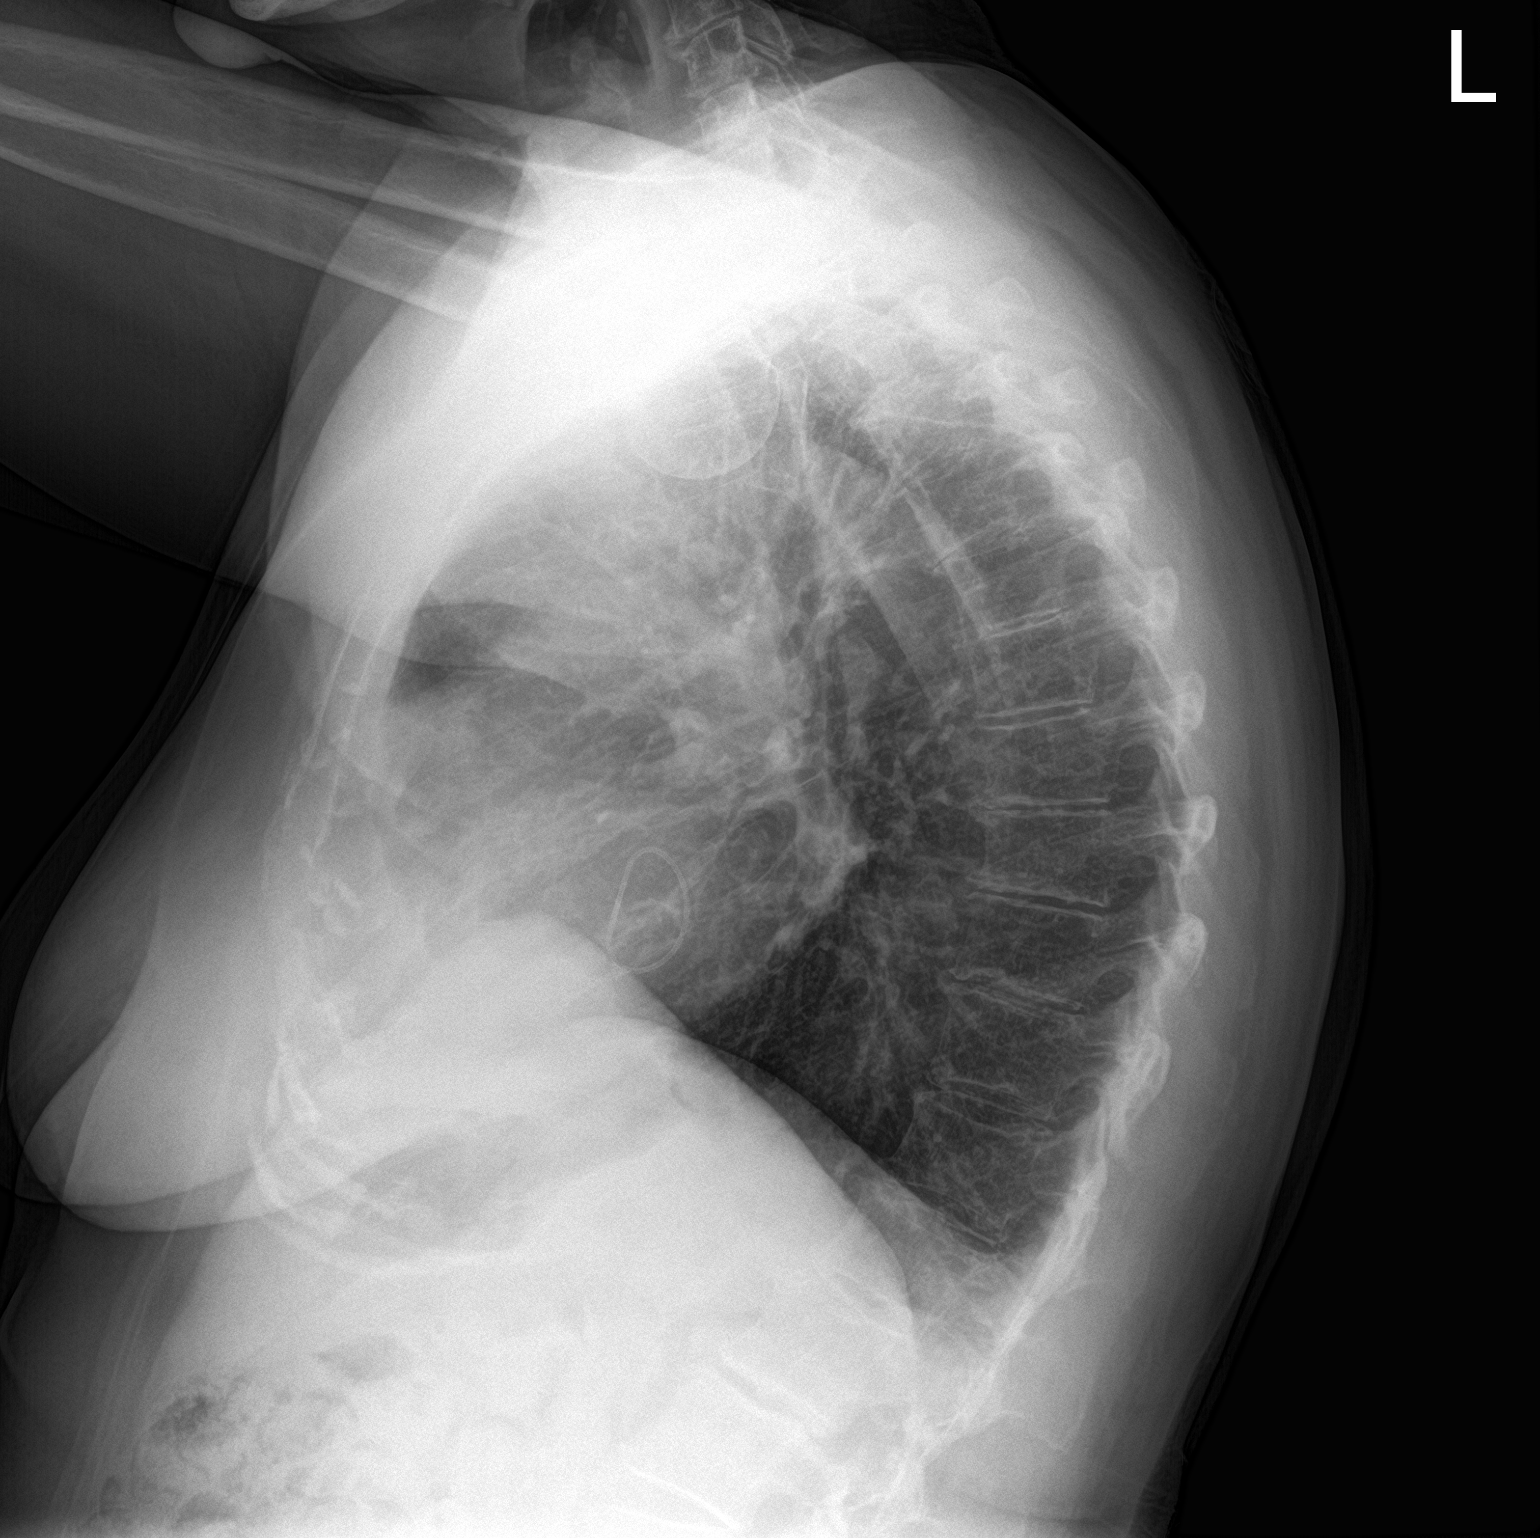

[2 of 2 positions shown; findings below may reference images not displayed]

FINDINGS: Lung volumes are normal. No consolidative airspace disease. No
pleural effusions. No pneumothorax. No pulmonary nodule or mass
noted. Pulmonary vasculature and the cardiomediastinal silhouette
are within normal limits. Status post mitral annuloplasty.
IMPRESSION: 1.  No radiographic evidence of acute cardiopulmonary disease.

## 2021-08-02 MED ORDER — PREDNISONE 50 MG PO TABS: ORAL_TABLET | ORAL | 0 refills | Status: DC

## 2021-08-02 MED ORDER — DOXYCYCLINE HYCLATE 100 MG PO CAPS: 100.0000 mg | ORAL_CAPSULE | Freq: Two times a day (BID) | ORAL | 0 refills | Status: AC

## 2021-08-02 MED ORDER — BENZONATATE 200 MG PO CAPS: 200.0000 mg | ORAL_CAPSULE | Freq: Three times a day (TID) | ORAL | 0 refills | Status: AC | PRN

## 2021-08-02 MED ORDER — FEXOFENADINE HCL 180 MG PO TABS: 180.0000 mg | ORAL_TABLET | Freq: Every day | ORAL | 0 refills | Status: DC

## 2021-08-02 NOTE — ED Provider Notes (Signed)
[name]    CSN: [id number] Arrival date & time: 08/02/21  0846      History   Chief Complaint Chief Complaint  Patient presents with   Sore Throat    Sore throat, cough, and nasal congestion. X2 days    HPI Mercedes Reid is a 80 y.o. female.   HPI 80 year old female presents with sore throat, cough, nasal/chest congestion x 2 days.  PMH significant for previous COVID-19 infection, severe mitral regurgitation, CHF, and paroxysmal atrial fibrillation.  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 07/31/2019   Trigger index finger of right hand 06/15/2019   Primary osteoarthritis of both knees 04/07/2018   Trochanteric bursitis of left hip 04/07/2018   Community acquired pneumonia of right middle lobe of lung 08/18/2016   Hypothyroidism (acquired) 06/26/2014   Disc degeneration, lumbosacral 02/25/2014   Fatty liver 02/25/2014   Neuralgia and neuritis 02/25/2014   Osteoarthritis 02/25/2014   Osteopenia 02/25/2014   Anxiety state 10/24/2013   H/O CHF    Thyroid disease    Hypertension    Paroxysmal atrial fibrillation (HCC)    Severe mitral regurgitation    Mitral valve prolapse     Past Surgical History:  Procedure Laterality Date   ABDOMINAL HYSTERECTOMY     COX-MAZE MICROWAVE ABLATION  02/28/2009   Dr. Barry Dienes left side lesion set.   MITRAL VALVE REPAIR  03/01/2011   Quadrangular resection of posterior leaflet with leafet plication and 30-mm SorinMEMO 3D ring annuloplasty)                 OB History   No obstetric history on file.      Home Medications     Prior to Admission medications   Medication Sig Start Date End Date Taking? Authorizing Provider  aspirin 81 MG tablet Take 81 mg by mouth daily.   Yes [provider]  baclofen (LIORESAL) 10 MG tablet Take 1 tablet (10 mg total) by mouth 3 (three) times daily. 04/13/21  Yes Trevor Iha, FNP  benzonatate (TESSALON) 200 MG capsule Take 1 capsule (200 mg total) by mouth 3 (three) times daily as needed for up to 10 days for cough. 08/02/21 08/12/21 Yes Trevor Iha, FNP  Calcium Carbonate-Vitamin D 600-200 MG-UNIT TABS Take by mouth.   Yes [provider]  diclofenac Sodium (VOLTAREN) 1 % GEL Apply 4 g topically 4 (four) times daily. To both knees 05/14/21  Yes Monica Becton, MD  doxycycline (VIBRAMYCIN) 100 MG capsule Take 1 capsule (100 mg total) by mouth 2 (two) times daily for 10 days. 08/02/21 08/12/21 Yes Trevor Iha, FNP  estradiol (ESTRACE) 1 MG tablet Take 1 mg by mouth daily.     Yes [provider]  fexofenadine (ALLEGRA ALLERGY) 180 MG tablet Take 1 tablet (180 mg total) by mouth daily for 15 days. 08/02/21 08/17/21 Yes Trevor Iha, FNP  gabapentin (NEURONTIN) 300 MG capsule One tab PO qHS for a week, then BID for a week, then TID. May double weekly to a max  of 3,600mg /day 02/09/20  Yes Monica Becton, MD  hydrOXYzine (ATARAX/VISTARIL) 10 MG tablet Take 1 tablet (10 mg total) by mouth every 8 (eight) hours as needed for itching. Caution: May cause drowsiness 03/20/20  Yes Lajean Manes, MD  levothyroxine (SYNTHROID) 100 MCG tablet Take 100 mcg by mouth daily before breakfast.   Yes [provider]  losartan (COZAAR) 100 MG tablet Take 100 mg by mouth daily. 12/20/13  Yes [provider]  metoprolol succinate (TOPROL-XL) 25 MG 24 hr tablet Take 12.5 mg by mouth daily.   Yes [provider]  predniSONE (DELTASONE) 50 MG tablet Take 1 tab p.o. daily for 5 days. 08/02/21  Yes Trevor Iha, FNP   triamterene-hydrochlorothiazide (DYAZIDE) 37.5-25 MG capsule Take 1 capsule by mouth daily.   Yes [provider]    Family History Family History  Problem Relation Age of Onset   Heart failure Father     Social History Social History   Tobacco Use   Smoking status: Never   Smokeless tobacco: Never  Substance Use Topics   Alcohol use: No   Drug use: No     Allergies   Latex, Other, Sulfa antibiotics, and Amoxicillin-pot clavulanate   Review of Systems Review of Systems  HENT:  Positive for congestion and postnasal drip.   Respiratory:  Positive for cough and shortness of breath.   All other systems reviewed and are negative.   Physical Exam Triage Vital Signs ED Triage Vitals  Enc Vitals Group     BP 08/02/21 0942 (!) 141/82     Pulse Rate 08/02/21 0942 68     Resp 08/02/21 0942 18     Temp 08/02/21 0942 98.2 F (36.8 C)     Temp Source 08/02/21 0942 Oral     SpO2 08/02/21 0942 97 %     Weight 08/02/21 0940 170 lb (77.1 kg)     Height 08/02/21 0940 5\' 5"  (1.651 m)     Head Circumference --      Peak Flow --      Pain Score 08/02/21 0940 4     Pain Loc --      Pain Edu? --      Excl. in GC? --    No data found.  Updated Vital Signs BP (!) 141/82 (BP Location: Right Arm)    Pulse 68    Temp 98.2 F (36.8 C) (Oral)    Resp 18    Ht 5\' 5"  (1.651 m)    Wt 170 lb (77.1 kg)    SpO2 97%    BMI 28.29 kg/m       Physical Exam Vitals and nursing note reviewed.  Constitutional:      General: She is not in acute distress.    Appearance: Normal appearance. She is normal weight. She is ill-appearing.  HENT:     Right Ear: Tympanic membrane, ear canal and external ear normal.     Left Ear: Tympanic membrane, ear canal and external ear normal.     Mouth/Throat:     Mouth: Mucous membranes are moist.     Pharynx: Oropharynx is clear.  Eyes:     Extraocular Movements: Extraocular movements intact.     Conjunctiva/sclera: Conjunctivae normal.      Pupils: Pupils are equal, round, and reactive to light.  Cardiovascular:     Rate and Rhythm: Normal rate and regular rhythm.     Pulses: Normal pulses.     Heart sounds: Normal heart  sounds.  Pulmonary:     Effort: Pulmonary effort is normal.     Breath sounds: No wheezing, rhonchi or rales.     Comments: Diminished breath sounds noted throughout Musculoskeletal:     Cervical back: Normal range of motion and neck supple.  Skin:    General: Skin is warm and dry.  Neurological:     General: No focal deficit present.     Mental Status: She is alert and oriented to person, place, and time. Mental status is at baseline.     UC Treatments / Results  Labs (all labs ordered are listed, but only abnormal results are displayed) Labs Reviewed  POC SARS CORONAVIRUS 2 AG -  ED - Normal  POCT INFLUENZA A/B - Normal    EKG   Radiology DG Chest 2 View  Result Date: 08/02/2021 CLINICAL DATA:  80 year old female with history of cough, congestion and fatigue. EXAM: CHEST - 2 VIEW COMPARISON:  Chest x-ray 11/27/2016. FINDINGS: Lung volumes are normal. No consolidative airspace disease. No pleural effusions. No pneumothorax. No pulmonary nodule or mass noted. Pulmonary vasculature and the cardiomediastinal silhouette are within normal limits. Status post mitral annuloplasty. IMPRESSION: 1.  No radiographic evidence of acute cardiopulmonary disease. Electronically Signed   By: Trudie Reed M.D.   On: 08/02/2021 10:58    Procedures Procedures (including critical care time)  Medications Ordered in UC Medications - No data to display  Initial Impression / Assessment and Plan / UC Course  I have reviewed the triage vital signs and the nursing notes.  Pertinent labs & imaging results that were available during my care of the patient were reviewed by me and considered in my medical decision making (see chart for details).     MDM: 1.  Cough-CXR was negative for acute cardiopulmonary process  (above), Rx'd Doxycycline, Prednisone and Tessalon Perles. 2.  Allergic rhinitis-Rx'd Allegra. Advised/instructed patient to take medication as directed with food to completion.  Advised patient to take Prednisone and Allegra with first dose of Doxycycline for the next 5 to 10 days.  Advised may use Allegra afterwards as needed for concurrent postnasal drip/drainage.  Advised may use Tessalon Perles daily or as needed for cough.  Encouraged patient to increase daily water intake while taking these medications.  Patient discharged home, hemodynamically stable. Final Clinical Impressions(s) / UC Diagnoses   Final diagnoses:  Cough, unspecified type  Allergic rhinitis, unspecified seasonality, unspecified trigger     Discharge Instructions      Advised/instructed patient to take medication as directed with food to completion.  Advised patient to take Prednisone and Allegra with first dose of Doxycycline for the next 5 to 10 days.  Advised may use Allegra afterwards as needed for concurrent postnasal drip/drainage.  Advised may use Tessalon Perles daily or as needed for cough.  Encouraged patient to increase daily water intake while taking these medications.     ED Prescriptions     Medication Sig Dispense Auth. Provider   doxycycline (VIBRAMYCIN) 100 MG capsule Take 1 capsule (100 mg total) by mouth 2 (two) times daily for 10 days. 20 capsule Trevor Iha, FNP   predniSONE (DELTASONE) 50 MG tablet Take 1 tab p.o. daily for 5 days. 5 tablet Trevor Iha, FNP   benzonatate (TESSALON) 200 MG capsule Take 1 capsule (200 mg total) by mouth 3 (three) times daily as needed for up to 10 days for cough. 50 capsule Trevor Iha, FNP   fexofenadine [facility] ALLERGY) 180 MG tablet Take  1 tablet (180 mg total) by mouth daily for 15 days. 15 tablet Trevor Iha, FNP      PDMP not reviewed this encounter.   Trevor Iha, FNP 08/02/21 1132

## 2021-08-02 NOTE — ED Triage Notes (Signed)
Pt states that she has a sore throat, nasal congestion and cough.  Pt states that she is vaccinated for covid. Pt states that she hasn't had flu vaccine.

## 2021-08-02 NOTE — Discharge Instructions (Addendum)
Advised/instructed patient to take medication as directed with food to completion.  Advised patient to take Prednisone and Allegra with first dose of Doxycycline for the next 5 to 10 days.  Advised may use Allegra afterwards as needed for concurrent postnasal drip/drainage.  Advised may use Tessalon Perles daily or as needed for cough.  Encouraged patient to increase daily water intake while taking these medications.

## 2021-08-05 ENCOUNTER — Ambulatory Visit: Payer: Medicare PPO | Admitting: Sports Medicine

## 2021-08-05 ENCOUNTER — Ambulatory Visit (INDEPENDENT_AMBULATORY_CARE_PROVIDER_SITE_OTHER): Payer: Medicare PPO

## 2021-08-05 ENCOUNTER — Other Ambulatory Visit: Payer: Self-pay

## 2021-08-05 DIAGNOSIS — M1612 Unilateral primary osteoarthritis, left hip: Secondary | ICD-10-CM

## 2021-08-05 DIAGNOSIS — M170 Bilateral primary osteoarthritis of knee: Secondary | ICD-10-CM

## 2021-08-05 NOTE — Assessment & Plan Note (Signed)
Increasing pain left hip, last injected in September, repeat left hip joint injection today.

## 2021-08-05 NOTE — Progress Notes (Signed)
° ° °  Procedures performed today:    Procedure: Real-time Ultrasound Guided injection of the left hip joint Device: Samsung HS60  Verbal informed consent obtained.  Time-out conducted.  Noted no overlying erythema, induration, or other signs of local infection.  Skin prepped in a sterile fashion.  Local anesthesia: Topical Ethyl chloride.  With sterile technique and under real time ultrasound guidance: Noted arthritic joint, 1 cc Kenalog 40, 2 cc lidocaine, 2 cc bupivacaine injected easily Completed without difficulty  Advised to call if fevers/chills, erythema, induration, drainage, or persistent bleeding.  Images permanently stored and available for review in PACS.  Impression: Technically successful ultrasound guided injection.  Procedure: Real-time Ultrasound Guided injection of the left knee joint Device: Samsung HS60  Verbal informed consent obtained.  Time-out conducted.  Noted no overlying erythema, induration, or other signs of local infection.  Skin prepped in a sterile fashion.  Local anesthesia: Topical Ethyl chloride.  With sterile technique and under real time ultrasound guidance: Noted trace effusion, 1 cc Kenalog 40, 2 cc lidocaine, 2 cc bupivacaine injected easily Completed without difficulty  Advised to call if fevers/chills, erythema, induration, drainage, or persistent bleeding.  Images permanently stored and available for review in PACS.  Impression: Technically successful ultrasound guided injection.  Independent interpretation of notes and tests performed by another provider:   None.  Brief History, Exam, Impression, and Recommendations:    Primary osteoarthritis of left hip Increasing pain left hip, last injected in September, repeat left hip joint injection today.  Primary osteoarthritis of both knees Increasing knee pain as well, she did well after a series of Orthovisc that ended in June 2022, increasing pain left knee, posterior joint line, repeat  steroid injection today. Return to see me in a month for this.    ___________________________________________ Ihor Austin. Benjamin Stain, M.D., ABFM., CAQSM. Primary Care and Sports Medicine [facility]  Adjunct Instructor of Family Medicine  [employer]

## 2021-08-05 NOTE — Assessment & Plan Note (Signed)
Increasing knee pain as well, she did well after a series of Orthovisc that ended in June 2022, increasing pain left knee, posterior joint line, repeat steroid injection today. Return to see me in a month for this.

## 2021-09-02 ENCOUNTER — Other Ambulatory Visit: Payer: Self-pay

## 2021-09-02 ENCOUNTER — Ambulatory Visit: Payer: Medicare PPO | Admitting: Sports Medicine

## 2021-09-02 DIAGNOSIS — M170 Bilateral primary osteoarthritis of knee: Secondary | ICD-10-CM

## 2021-09-02 DIAGNOSIS — E538 Deficiency of other specified B group vitamins: Secondary | ICD-10-CM | POA: Diagnosis not present

## 2021-09-02 DIAGNOSIS — M1612 Unilateral primary osteoarthritis, left hip: Secondary | ICD-10-CM

## 2021-09-02 MED ORDER — CYANOCOBALAMIN 1000 MCG/ML IJ SOLN
1000.0000 ug | Freq: Once | INTRAMUSCULAR | Status: AC
  Administered 2021-09-02: 1000 ug via INTRAMUSCULAR

## 2021-09-02 NOTE — Assessment & Plan Note (Signed)
Left hip joint injected at the last visit, doing well today.

## 2021-09-02 NOTE — Progress Notes (Signed)
° ° °  Procedures performed today:    None.  Independent interpretation of notes and tests performed by another provider:   None.  Brief History, Exam, Impression, and Recommendations:    Primary osteoarthritis of both knees Pleasant 81 year old female returns, she did well after a series of Orthovisc that ended in June 2022, at the last visit she was having increasing pain left knee posterior joint line, injected the last visit and returns today pain-free.  Primary osteoarthritis of left hip Left hip joint injected at the last visit, doing well today.  Vitamin B12 deficiency Currently sees Dr. Faythe Casa in [facility], she does have a history of a chronic vitamin B12 deficiency and has historically had B12 injections, she has not had a B12 injection in over a year and is noticing paresthesias in her feet as well as poor balance. We will give her a 1000 mcg cyanocobalamin intramuscular injection today, further injections need to be with her PCP, I will also restart her physical therapy.  Chronic process with exacerbation and pharmacologic intervention  ___________________________________________ Gwen Her. Dianah Field, M.D., ABFM., CAQSM. Primary Care and [place] Instructor of [place] [employer]

## 2021-09-02 NOTE — Assessment & Plan Note (Signed)
Pleasant 81 year old female returns, she did well after a series of Orthovisc that ended in June 2022, at the last visit she was having increasing pain left knee posterior joint line, injected the last visit and returns today pain-free.

## 2021-09-02 NOTE — Addendum Note (Signed)
Addended by: Gaynelle Arabian on: 09/02/2021 10:07 AM   Modules accepted: Orders

## 2021-09-02 NOTE — Assessment & Plan Note (Signed)
Currently sees Dr. Darnelle Maffucci in [facility], she does have a history of a chronic vitamin B12 deficiency and has historically had B12 injections, she has not had a B12 injection in over a year and is noticing paresthesias in her feet as well as poor balance. We will give her a 1000 mcg cyanocobalamin intramuscular injection today, further injections need to be with her PCP, I will also restart her physical therapy.

## 2021-09-17 ENCOUNTER — Encounter: Payer: Self-pay | Admitting: Emergency Medicine

## 2021-09-17 ENCOUNTER — Emergency Department
Admission: EM | Admit: 2021-09-17 | Discharge: 2021-09-17 | Disposition: A | Discharge status: Home / Self Care | Payer: Medicare PPO | Source: Home / Self Care

## 2021-09-17 DIAGNOSIS — J0100 Acute maxillary sinusitis, unspecified: Secondary | ICD-10-CM | POA: Diagnosis not present

## 2021-09-17 DIAGNOSIS — J309 Allergic rhinitis, unspecified: Secondary | ICD-10-CM | POA: Diagnosis not present

## 2021-09-17 DIAGNOSIS — R110 Nausea: Secondary | ICD-10-CM | POA: Diagnosis not present

## 2021-09-17 DIAGNOSIS — J3489 Other specified disorders of nose and nasal sinuses: Secondary | ICD-10-CM | POA: Diagnosis not present

## 2021-09-17 MED ORDER — PREDNISONE 20 MG PO TABS: ORAL_TABLET | ORAL | 0 refills | Status: DC

## 2021-09-17 MED ORDER — ONDANSETRON 8 MG PO TBDP: 8.0000 mg | ORAL_TABLET | Freq: Three times a day (TID) | ORAL | 0 refills | Status: DC | PRN

## 2021-09-17 MED ORDER — CEFDINIR 300 MG PO CAPS: 300.0000 mg | ORAL_CAPSULE | Freq: Two times a day (BID) | ORAL | 0 refills | Status: AC

## 2021-09-17 MED ORDER — ONDANSETRON 4 MG PO TBDP
4.0000 mg | ORAL_TABLET | Freq: Once | ORAL | Status: AC
  Administered 2021-09-17: 4 mg via ORAL

## 2021-09-17 MED ORDER — FEXOFENADINE HCL 180 MG PO TABS
180.0000 mg | ORAL_TABLET | Freq: Every day | ORAL | 0 refills | Status: AC
Start: 2021-09-17 — End: 2021-10-17

## 2021-09-17 NOTE — Discharge Instructions (Addendum)
Advised patient to take medication as directed with food to completion.  Advised patient to take Allegra with first dose of Cefdinir.  Advised patient to take prednisone with first dose of Cefdinir for the next 5 of 10 days.  Advised patient may use Zofran daily or as needed for nausea.  Encouraged patient to increase daily water intake while taking this medication.

## 2021-09-17 NOTE — ED Triage Notes (Signed)
Sinus pressure ongoing since last visit  R ear pain - intermittent  Nausea from sinus drainage  Mucinex OTC yesterday  Allegra helped from last visit

## 2021-09-17 NOTE — ED Provider Notes (Signed)
[facility]    CSN: [id number] Arrival date & time: 09/17/21  0803      History   Chief Complaint Chief Complaint  Patient presents with   Facial Pain    HPI Mercedes [facility] is a 81 y.o. female.   HPI 81 year old female presents with ongoing sinus pressure since last visit (08/02/2021), reports intermittent right ear pain and nausea from sinus drainage.  Reports taking OTC Mucinex with little to no relief.  Reports Allegra from last visit has helped with postnasal drainage/drip/rhinorrhea.  PMH significant for paroxysmal atrial fibrillation and HTN.  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 07/31/2019   Trigger index finger of right hand 06/15/2019   Primary osteoarthritis of both knees 04/07/2018   Trochanteric bursitis of left hip 04/07/2018   Community acquired pneumonia of right middle lobe of lung 08/18/2016   Hypothyroidism (acquired) 06/26/2014   Disc degeneration, lumbosacral 02/25/2014   Fatty liver 02/25/2014   Neuralgia and neuritis 02/25/2014   Osteoarthritis 02/25/2014   Osteopenia 02/25/2014   Anxiety state 10/24/2013   H/O CHF    Thyroid disease    Hypertension    Paroxysmal atrial fibrillation (HCC)    Severe mitral regurgitation    Mitral valve prolapse     Past Surgical History:  Procedure Laterality Date   ABDOMINAL HYSTERECTOMY     COX-MAZE MICROWAVE ABLATION  02/28/2009   Dr. Barry Dieneswens left side lesion set.   MITRAL VALVE REPAIR  03/01/2011   Quadrangular resection of posterior leaflet with leafet plication and 30-mm  SorinMEMO 3D ring annuloplasty)                 OB History   No obstetric history on file.      Home Medications    Prior to Admission medications   Medication Sig Start Date End Date Taking? Authorizing Provider  cefdinir (OMNICEF) 300 MG capsule Take 1 capsule (300 mg total) by mouth 2 (two) times daily for 10 days. 09/17/21 09/27/21 Yes Trevor Ihaagan, Zannie Runkle, FNP  Cholecalciferol (VITAMIN D3) 1.25 MG (50000 UT) TABS Take 1.25 mg by mouth daily at 2 PM. OTC- pt is taking 2 daily   Yes [provider]  fexofenadine (ALLEGRA ALLERGY) 180 MG tablet Take 1 tablet (180 mg total) by mouth daily. 09/17/21 10/17/21 Yes Trevor Ihaagan, Carlesha Seiple, FNP  ondansetron (ZOFRAN-ODT) 8 MG disintegrating tablet Take 1 tablet (8 mg total) by mouth every 8 (eight) hours as needed for nausea or vomiting. 09/17/21  Yes Trevor Ihaagan, Kurstin Dimarzo, FNP  predniSONE (DELTASONE) 20 MG tablet Take 3 tabs PO daily x 5 days. 09/17/21  Yes Trevor Ihaagan, Shelvia Fojtik, FNP  aspirin 81 MG tablet Take 81 mg by mouth daily. Patient not taking: Reported on 09/17/2021    [provider]  baclofen (LIORESAL) 10 MG tablet Take 1 tablet (10 mg total) by mouth 3 (three) times daily. Patient not taking: Reported on 09/17/2021 04/13/21   Trevor Ihaagan, Haizel Gatchell, FNP  Calcium Carbonate-Vitamin D 600-200 MG-UNIT TABS Take by mouth. Patient  not taking: Reported on 09/17/2021    [provider]  diclofenac Sodium (VOLTAREN) 1 % GEL Apply 4 g topically 4 (four) times daily. To both knees Patient not taking: Reported on 09/17/2021 05/14/21   Monica Becton, MD  estradiol (ESTRACE) 1 MG tablet Take 1 mg by mouth daily.      [provider]  gabapentin (NEURONTIN) 300 MG capsule One tab PO qHS for a week, then BID for a week, then TID. May double weekly to a max of 3,600mg /day Patient not taking: Reported on 09/17/2021 02/09/20   Monica Becton, MD  hydrOXYzine (ATARAX/VISTARIL) 10 MG tablet Take 1 tablet (10 mg total) by mouth every 8 (eight) hours  as needed for itching. Caution: May cause drowsiness Patient not taking: Reported on 09/17/2021 03/20/20   Lajean Manes, MD  levothyroxine (SYNTHROID) 100 MCG tablet Take 100 mcg by mouth daily before breakfast.    [provider]  losartan (COZAAR) 100 MG tablet Take 100 mg by mouth daily. 12/20/13   [provider]  metoprolol succinate (TOPROL-XL) 25 MG 24 hr tablet Take 12.5 mg by mouth daily.    [provider]  triamterene-hydrochlorothiazide (DYAZIDE) 37.5-25 MG capsule Take 1 capsule by mouth daily.    [provider]    Family History Family History  Problem Relation Age of Onset   Heart failure Father     Social History Social History   Tobacco Use   Smoking status: Never   Smokeless tobacco: Never  Substance Use Topics   Alcohol use: No   Drug use: No     Allergies   Latex, Other, Sulfa antibiotics, and Amoxicillin-pot clavulanate   Review of Systems Review of Systems  HENT:  Positive for congestion, ear pain, postnasal drip, sinus pressure and sinus pain.   Respiratory:  Positive for cough.   Gastrointestinal:  Positive for nausea.  All other systems reviewed and are negative.   Physical Exam Triage Vital Signs ED Triage Vitals  Enc Vitals Group     BP 09/17/21 0816 122/70     Pulse Rate 09/17/21 0816 70     Resp 09/17/21 0816 16     Temp 09/17/21 0816 98.8 F (37.1 C)     Temp Source 09/17/21 0816 Oral     SpO2 09/17/21 0816 99 %     Weight 09/17/21 0820 167 lb (75.8 kg)     Height 09/17/21 0820 5\' 5"  (1.651 m)     Head Circumference --      Peak Flow --      Pain Score 09/17/21 0820 3     Pain Loc --      Pain Edu? --      Excl. in GC? --    No data found.  Updated Vital Signs BP 122/70 (BP Location: Left Arm)    Pulse 70    Temp 98.8 F (37.1 C) (Oral)    Resp 16    Ht 5\' 5"  (1.651 m)    Wt 167 lb (75.8 kg)    SpO2 99%    BMI 27.79 kg/m     Physical Exam Vitals and nursing note reviewed.   Constitutional:      General: She is not in acute distress.    Appearance: Normal appearance. She is normal weight. She is not ill-appearing.  HENT:     Head: Normocephalic and atraumatic.     Right Ear: Tympanic membrane and external ear normal.     Left  Ear: Tympanic membrane and external ear normal.     Ears:     Comments: Moderate to significant eustachian tube dysfunction noted bilaterally    Nose:     Right Sinus: Maxillary sinus tenderness present.     Left Sinus: Maxillary sinus tenderness present.     Comments: Turbinates are erythematous/edematous    Mouth/Throat:     Mouth: Mucous membranes are moist.     Pharynx: Oropharynx is clear.     Comments: Significant amount of clear drainage of posterior oropharynx noted Eyes:     Extraocular Movements: Extraocular movements intact.     Conjunctiva/sclera: Conjunctivae normal.     Pupils: Pupils are equal, round, and reactive to light.  Cardiovascular:     Rate and Rhythm: Normal rate and regular rhythm.     Pulses: Normal pulses.     Heart sounds: Normal heart sounds.  Pulmonary:     Effort: Pulmonary effort is normal.     Breath sounds: Normal breath sounds.  Musculoskeletal:     Cervical back: Normal range of motion and neck supple.  Skin:    General: Skin is warm and dry.  Neurological:     General: No focal deficit present.     Mental Status: She is alert and oriented to person, place, and time.     UC Treatments / Results  Labs (all labs ordered are listed, but only abnormal results are displayed) Labs Reviewed - No data to display  EKG   Radiology No results found.  Procedures Procedures (including critical care time)  Medications Ordered in UC Medications  ondansetron (ZOFRAN-ODT) disintegrating tablet 4 mg (4 mg Oral Given 09/17/21 0830)    Initial Impression / Assessment and Plan / UC Course  I have reviewed the triage vital signs and the nursing notes.  Pertinent labs & imaging results that  were available during my care of the patient were reviewed by me and considered in my medical decision making (see chart for details).     MDM: 1.  Subacute maxillary sinusitis-Rx'd cefdinir; 2.  Sinus pressure-Rx'd prednisone; 3.  Allergic rhinitis-Rx'd Allegra; 4.  Nausea-patient given 4 mg of ODT Zofran while in clinic this morning, Rx'd Zofran. Advised patient to take medication as directed with food to completion.  Advised patient to take Allegra with first dose of Cefdinir.  Advised patient to take prednisone with first dose of Cefdinir for the next 5 of 10 days.  Advised patient may use Zofran daily or as needed for nausea.  Encouraged patient to increase daily water intake while taking this medication.  Patient discharged home, hemodynamically stable. Final Clinical Impressions(s) / UC Diagnoses   Final diagnoses:  Subacute maxillary sinusitis  Sinus pressure  Allergic rhinitis, unspecified seasonality, unspecified trigger  Nausea     Discharge Instructions      Advised patient to take medication as directed with food to completion.  Advised patient to take Allegra with first dose of Cefdinir.  Advised patient to take prednisone with first dose of Cefdinir for the next 5 of 10 days.  Advised patient may use Zofran daily or as needed for nausea.  Encouraged patient to increase daily water intake while taking this medication.     ED Prescriptions     Medication Sig Dispense Auth. Provider   cefdinir (OMNICEF) 300 MG capsule Take 1 capsule (300 mg total) by mouth 2 (two) times daily for 10 days. 20 capsule Trevor Iha, FNP   predniSONE (DELTASONE) 20 MG tablet Take  3 tabs PO daily x 5 days. 15 tablet Trevor Iha, FNP   fexofenadine [facility] ALLERGY) 180 MG tablet Take 1 tablet (180 mg total) by mouth daily. 30 tablet Trevor Iha, FNP   ondansetron (ZOFRAN-ODT) 8 MG disintegrating tablet Take 1 tablet (8 mg total) by mouth every 8 (eight) hours as needed for nausea or  vomiting. 24 tablet Trevor Iha, FNP      PDMP not reviewed this encounter.   Trevor Iha, FNP 09/17/21 [phone]

## 2021-09-27 ENCOUNTER — Other Ambulatory Visit: Payer: Self-pay

## 2021-09-27 ENCOUNTER — Emergency Department (INDEPENDENT_AMBULATORY_CARE_PROVIDER_SITE_OTHER): Payer: Medicare PPO

## 2021-09-27 ENCOUNTER — Emergency Department
Admission: EM | Admit: 2021-09-27 | Discharge: 2021-09-27 | Disposition: A | Discharge status: Home / Self Care | Payer: Medicare PPO | Source: Home / Self Care

## 2021-09-27 DIAGNOSIS — M25551 Pain in right hip: Secondary | ICD-10-CM

## 2021-09-27 IMAGING — DX DG HIP (WITH OR WITHOUT PELVIS) 2-3V*R*
3 series · 3 of 3 positions shown · non-contrast
Comparison: [DATE]

CLINICAL DATA: Right hip pain x 3 days, NKI. No artifact from pants
that I could see, however pt did st she had a melanoma removed
around the abdomen towards her right hip.

EXAM:
DG HIP (WITH OR WITHOUT PELVIS) 2-3V RIGHT

[pelvis ap]
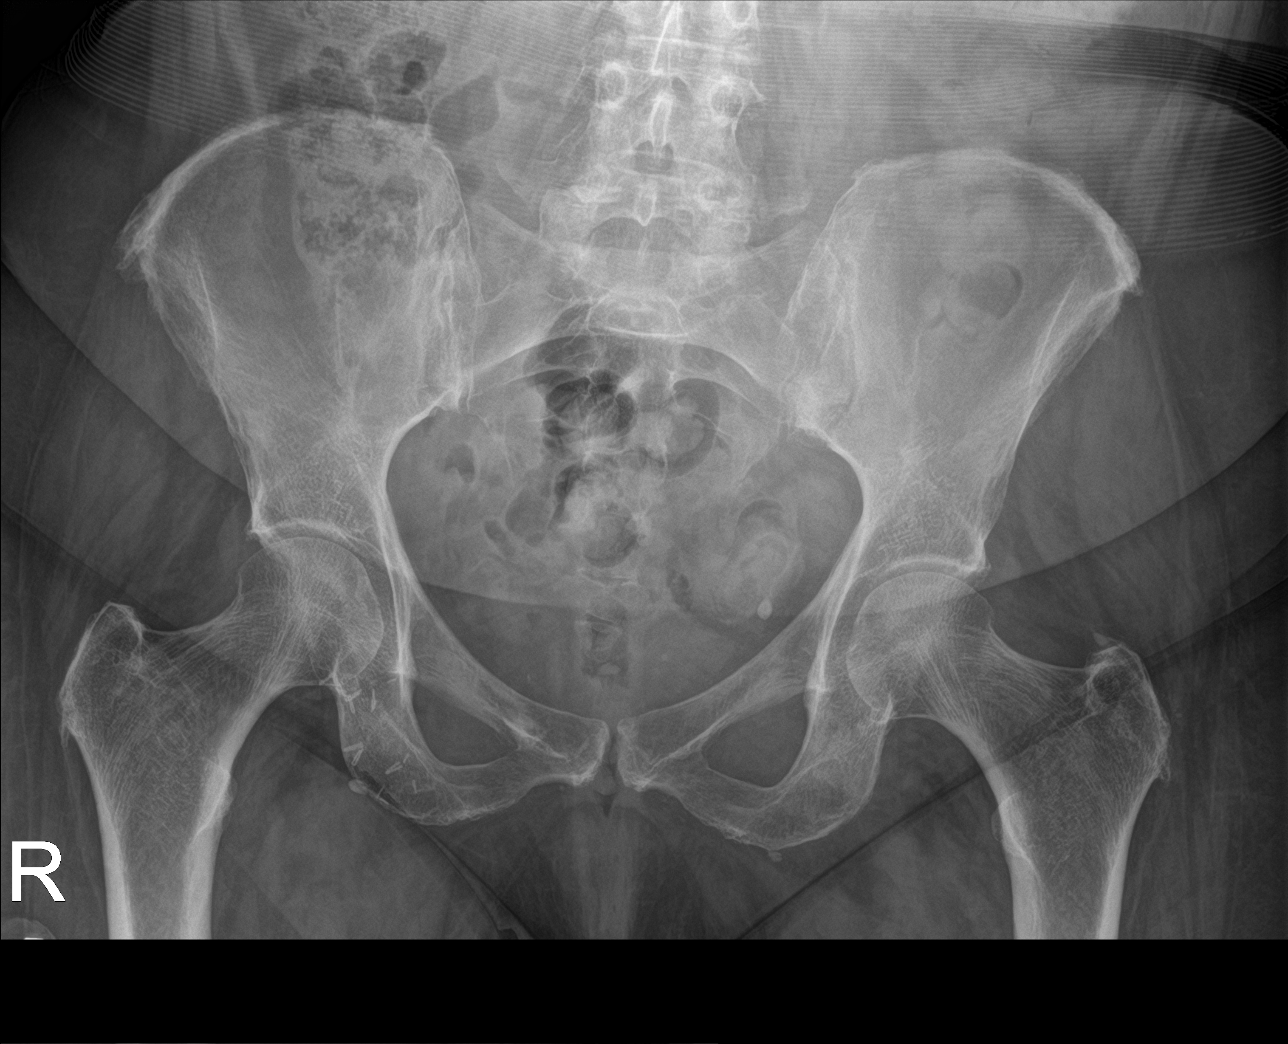

[hip ap]
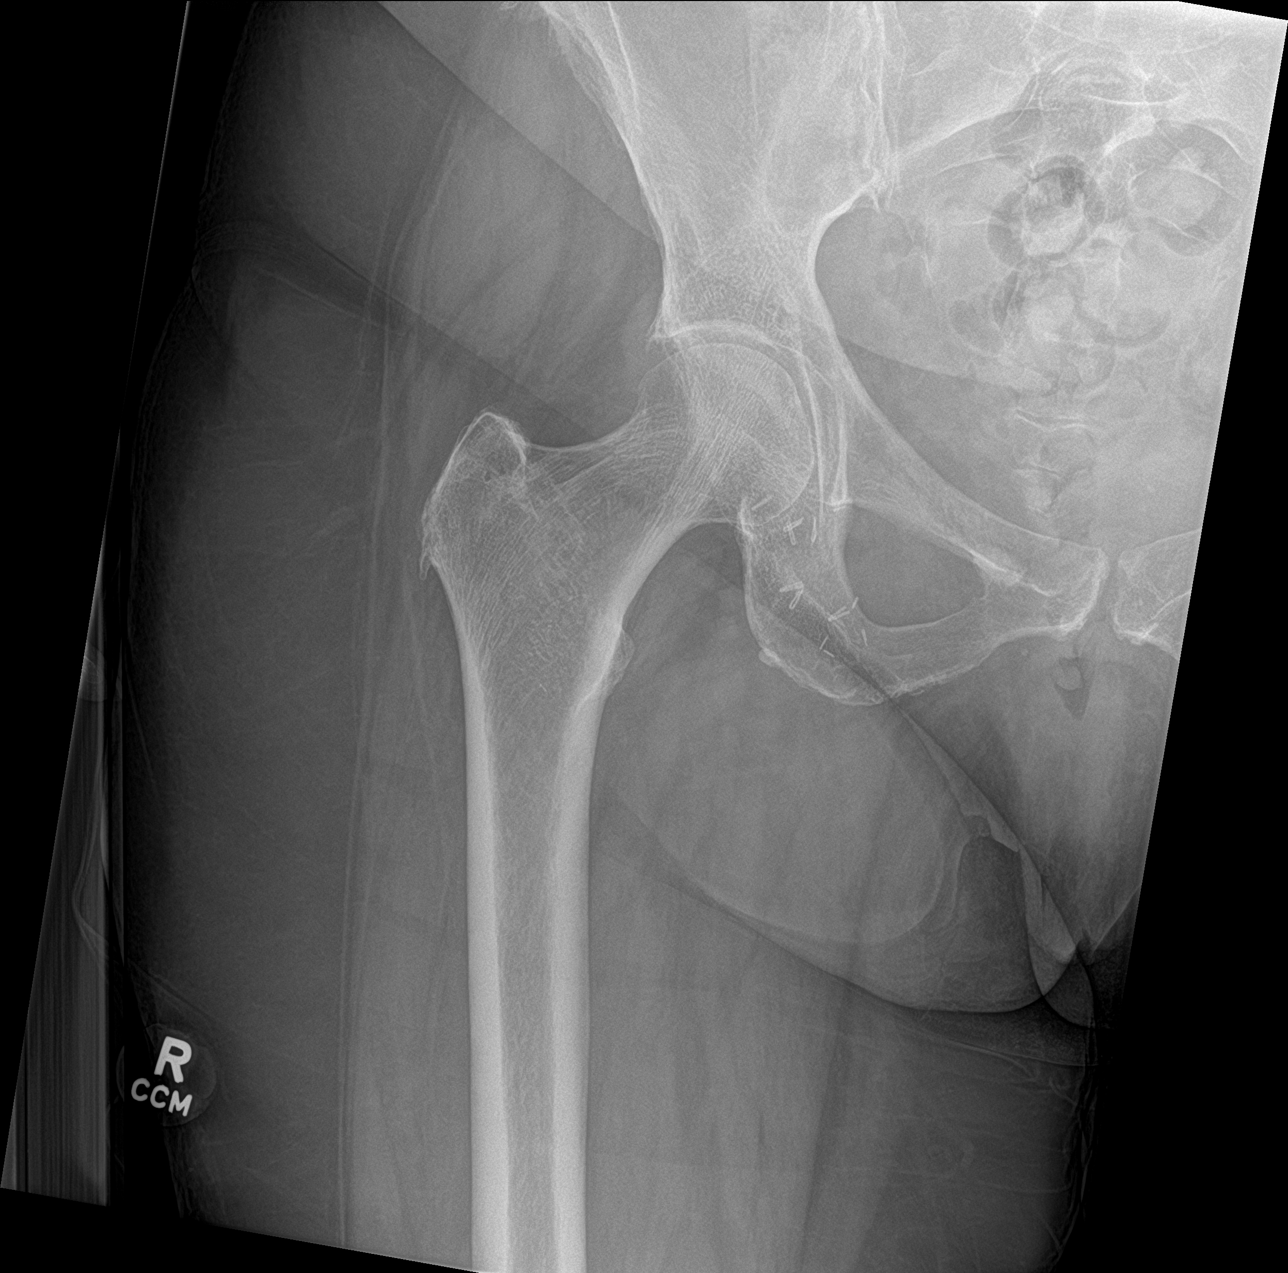

[hip lat]
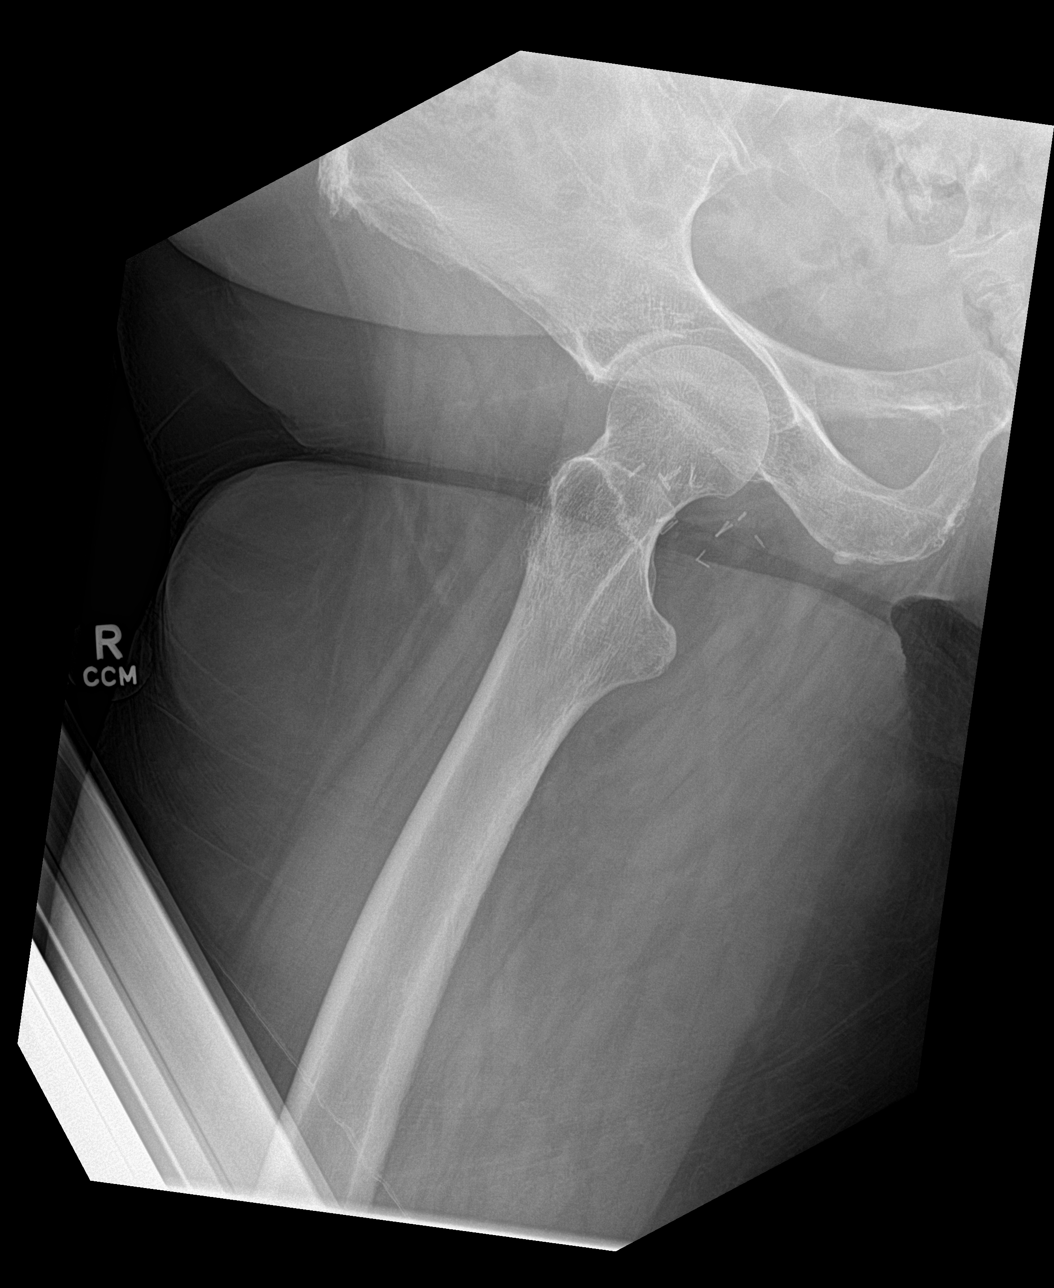

[3 of 3 positions shown; findings below may reference images not displayed]

FINDINGS: No fracture or bone lesion.

Hip joints, SI joints and symphysis pubis are normally spaced and
aligned.

There are surgical vascular clips overlying the right inguinal
region, unchanged.
IMPRESSION: No fracture, bone lesion or hip joint abnormality.

## 2021-09-27 MED ORDER — CELECOXIB 100 MG PO CAPS: 100.0000 mg | ORAL_CAPSULE | Freq: Two times a day (BID) | ORAL | 0 refills | Status: AC

## 2021-09-27 MED ORDER — TRAMADOL HCL 50 MG PO TABS: 50.0000 mg | ORAL_TABLET | Freq: Two times a day (BID) | ORAL | 0 refills | Status: DC | PRN

## 2021-09-27 NOTE — ED Provider Notes (Signed)
[facility]    CSN: [id number] Arrival date & time: 09/27/21  1332      History   Chief Complaint Chief Complaint  Patient presents with   Hip Pain    Hip pain x2 days    HPI Mercedes [facility] is a 81 y.o. female.   HPI 81 year old female presents with right hip pain for 2 days.  PMH significant for paroxysmal atrial fibrillation and CHF.  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 07/31/2019   Trigger index finger of right hand 06/15/2019   Primary osteoarthritis of both knees 04/07/2018   Trochanteric bursitis of left hip 04/07/2018   Community acquired pneumonia of right middle lobe of lung 08/18/2016   Hypothyroidism (acquired) 06/26/2014   Disc degeneration, lumbosacral 02/25/2014   Fatty liver 02/25/2014   Neuralgia and neuritis 02/25/2014   Osteoarthritis 02/25/2014   Osteopenia 02/25/2014   Anxiety state 10/24/2013   H/O CHF    Thyroid disease    Hypertension    Paroxysmal atrial fibrillation (HCC)    Severe mitral regurgitation    Mitral valve prolapse     Past Surgical History:  Procedure Laterality Date   ABDOMINAL HYSTERECTOMY     COX-MAZE MICROWAVE ABLATION  02/28/2009   Dr. Barry Dienes left side lesion set.   MITRAL VALVE REPAIR  03/01/2011   Quadrangular resection of posterior leaflet with leafet plication and 30-mm SorinMEMO 3D ring annuloplasty)                 OB History   No obstetric history on file.      Home Medications    Prior to Admission medications   Medication Sig Start Date End Date Taking? Authorizing  Provider  cefdinir (OMNICEF) 300 MG capsule Take 1 capsule (300 mg total) by mouth 2 (two) times daily for 10 days. 09/17/21 09/27/21 Yes Trevor Iha, FNP  celecoxib (CELEBREX) 100 MG capsule Take 1 capsule (100 mg total) by mouth 2 (two) times daily for 15 days. 09/27/21 10/12/21 Yes Trevor Iha, FNP  Cholecalciferol (VITAMIN D3) 1.25 MG (50000 UT) TABS Take 1.25 mg by mouth daily at 2 PM. OTC- pt is taking 2 daily   Yes [provider]  diclofenac Sodium (VOLTAREN) 1 % GEL Apply 4 g topically 4 (four) times daily. To both knees 05/14/21  Yes Monica Becton, MD  estradiol (ESTRACE) 1 MG tablet Take 1 mg by mouth daily.     Yes [provider]  fexofenadine (ALLEGRA ALLERGY) 180 MG tablet Take 1 tablet (180 mg total) by mouth daily. 09/17/21 10/17/21 Yes Trevor Iha, FNP  gabapentin (NEURONTIN) 300 MG capsule One tab PO qHS for a week, then BID for a week, then TID. May double weekly to a max of 3,600mg /day 02/09/20  Yes Monica Becton, MD  levothyroxine (SYNTHROID) 100 MCG tablet Take 100 mcg by mouth daily before breakfast.   Yes [provider]  losartan (COZAAR) 100 MG tablet Take 100 mg by mouth daily. 12/20/13  Yes [provider]  metoprolol succinate (TOPROL-XL) 25 MG 24 hr tablet Take 12.5 mg by mouth daily.   Yes [provider]  ondansetron (ZOFRAN-ODT) 8 MG disintegrating tablet Take 1 tablet (8 mg total) by mouth every 8 (eight) hours as needed for nausea or vomiting. 09/17/21  Yes Trevor Iha, FNP  traMADol (ULTRAM) 50 MG tablet Take 1 tablet (50 mg total) by mouth 2 (two) times daily as needed. 09/27/21  Yes Trevor Iha, FNP  triamterene-hydrochlorothiazide (DYAZIDE) 37.5-25 MG capsule Take 1 capsule by mouth daily.   Yes [provider]  aspirin 81 MG tablet Take 81 mg by mouth daily. Patient not taking: Reported on 09/17/2021    [provider]  baclofen (LIORESAL) 10 MG tablet Take 1 tablet (10 mg  total) by mouth 3 (three) times daily. Patient not taking: Reported on 09/17/2021 04/13/21   Trevor Iha, FNP  Calcium Carbonate-Vitamin D 600-200 MG-UNIT TABS Take by mouth. Patient not taking: Reported on 09/17/2021    [provider]  hydrOXYzine (ATARAX/VISTARIL) 10 MG tablet Take 1 tablet (10 mg total) by mouth every 8 (eight) hours as needed for itching. Caution: May cause drowsiness Patient not taking: Reported on 09/17/2021 03/20/20   Lajean Manes, MD  predniSONE (DELTASONE) 20 MG tablet Take 3 tabs PO daily x 5 days. 09/17/21   Trevor Iha, FNP    Family History Family History  Problem Relation Age of Onset   Heart failure Father     Social History Social History   Tobacco Use   Smoking status: Never   Smokeless tobacco: Never  Substance Use Topics   Alcohol use: No   Drug use: No     Allergies   Latex, Other, Sulfa antibiotics, and Amoxicillin-pot clavulanate   Review of Systems Review of Systems  Musculoskeletal:        Right hip pain x 2 days    Physical Exam Triage Vital Signs ED Triage Vitals  Enc Vitals Group     BP 09/27/21 1402 104/68     Pulse Rate 09/27/21 1402 76     Resp 09/27/21 1402 18     Temp 09/27/21 1402 97.9 F (36.6 C)     Temp Source 09/27/21 1402 Oral     SpO2 09/27/21 1402 97 %     Weight 09/27/21 1400 164 lb (74.4 kg)     Height 09/27/21 1400 5\' 5"  (1.651 m)     Head Circumference --      Peak Flow --      Pain Score 09/27/21 1359 5     Pain Loc --      Pain Edu? --      Excl. in GC? --    No data found.  Updated Vital Signs BP 104/68 (BP Location: Left Arm)    Pulse 76    Temp 97.9 F (36.6 C) (Oral)    Resp 18    Ht 5\' 5"  (1.651 m)    Wt 164 lb (74.4 kg)    SpO2 97%    BMI 27.29 kg/m     Physical Exam Vitals and nursing note reviewed.  Constitutional:      Appearance: Normal appearance. She is normal weight.  HENT:     Head: Normocephalic and atraumatic.     Mouth/Throat:     Mouth: Mucous membranes  are moist.     Pharynx: Oropharynx is clear.  Eyes:     Extraocular Movements: Extraocular movements intact.  Conjunctiva/sclera: Conjunctivae normal.     Pupils: Pupils are equal, round, and reactive to light.  Cardiovascular:     Rate and Rhythm: Normal rate and regular rhythm.     Pulses: Normal pulses.     Heart sounds: Normal heart sounds.  Pulmonary:     Effort: Pulmonary effort is normal.     Breath sounds: Normal breath sounds.  Musculoskeletal:     Cervical back: Normal range of motion and neck supple.     Comments: Right hip: TTP over lateral inferior aspect, no deformity noted  Skin:    General: Skin is warm and dry.  Neurological:     General: No focal deficit present.     Mental Status: She is alert and oriented to person, place, and time.     UC Treatments / Results  Labs (all labs ordered are listed, but only abnormal results are displayed) Labs Reviewed - No data to display  EKG   Radiology DG Hip Unilat W or Wo Pelvis 2-3 Views Right  Result Date: 09/27/2021 CLINICAL DATA:  Right hip pain x 3 days, NKI. 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 EXAM: DG HIP (WITH OR WITHOUT PELVIS) 2-3V RIGHT COMPARISON:  01/25/2019 FINDINGS: No fracture or bone lesion. Hip joints, SI joints and symphysis pubis are normally spaced and aligned. There are surgical vascular clips overlying the right inguinal region, unchanged. IMPRESSION: No fracture, bone lesion or hip joint abnormality. Electronically Signed   By: Amie Portlandavid  Ormond M.D.   On: 09/27/2021 14:41    Procedures Procedures (including critical care time)  Medications Ordered in UC Medications - No data to display  Initial Impression / Assessment and Plan / UC Course  I have reviewed the triage vital signs and the nursing notes.  Pertinent labs & imaging results that were available during my care of the patient were reviewed by  me and considered in my medical decision making (see chart for details).     MDM: 1.  Right hip pain-right hip x-ray revealed no acute osseous process, Rx'd Celebrex and Tramadol. Advised/informed patient of right hip x-ray results today.  Advised patient to take medication (Celebrex) as directed with food to completion.  Advised patient may use Tramadol sparingly for breakthrough pain.  Encouraged patient to increase daily water intake while taking these medications.  Advised patient if right hip pain worsens and/or is unresolved please follow-up with PCP or here for further evaluation.  Discharged home, hemodynamically stable. Final Clinical Impressions(s) / UC Diagnoses   Final diagnoses:  Right hip pain     Discharge Instructions      Advised/informed patient of right hip x-ray results today.  Advised patient to take medication (Celebrex) as directed with food to completion.  Advised patient may use Tramadol sparingly for breakthrough pain.  Encouraged patient to increase daily water intake while taking these medications.  Advised patient if right hip pain worsens and/or is unresolved please follow-up with PCP or here for further evaluation.     ED Prescriptions     Medication Sig Dispense Auth. Provider   celecoxib (CELEBREX) 100 MG capsule Take 1 capsule (100 mg total) by mouth 2 (two) times daily for 15 days. 30 capsule Trevor Ihaagan, Maclovia Uher, FNP   traMADol (ULTRAM) 50 MG tablet Take 1 tablet (50 mg total) by mouth 2 (two) times daily as needed. 20 tablet Trevor Ihaagan, Mael Delap, FNP  I have reviewed the PDMP during this encounter.   Trevor Iha, FNP 09/27/21 1533

## 2021-09-27 NOTE — ED Triage Notes (Signed)
Pt states that she has some right hip pain. X2 days

## 2021-09-27 NOTE — Discharge Instructions (Addendum)
Advised/informed patient of right hip x-ray results today.  Advised patient to take medication (Celebrex) as directed with food to completion.  Advised patient may use Tramadol sparingly for breakthrough pain.  Encouraged patient to increase daily water intake while taking these medications.  Advised patient if right hip pain worsens and/or is unresolved please follow-up with PCP or here for further evaluation.

## 2021-11-25 ENCOUNTER — Ambulatory Visit: Payer: Medicare PPO | Admitting: Sports Medicine

## 2021-11-25 DIAGNOSIS — M1612 Unilateral primary osteoarthritis, left hip: Secondary | ICD-10-CM

## 2021-11-25 DIAGNOSIS — H1013 Acute atopic conjunctivitis, bilateral: Secondary | ICD-10-CM

## 2021-11-25 DIAGNOSIS — M170 Bilateral primary osteoarthritis of knee: Secondary | ICD-10-CM

## 2021-11-25 DIAGNOSIS — H1010 Acute atopic conjunctivitis, unspecified eye: Secondary | ICD-10-CM | POA: Insufficient documentation

## 2021-11-25 MED ORDER — TRAMADOL HCL 50 MG PO TABS: 50.0000 mg | ORAL_TABLET | Freq: Three times a day (TID) | ORAL | 3 refills | Status: DC | PRN

## 2021-11-25 MED ORDER — AZELASTINE HCL 0.05 % OP SOLN: 2.0000 [drp] | Freq: Two times a day (BID) | OPHTHALMIC | 11 refills | Status: AC

## 2021-11-25 NOTE — Assessment & Plan Note (Signed)
Bilateral itchy and heavy, vision overall normal with the exception of typical presbyopia. ?She would like recommendations for an eye provider so I will get her referred downstairs, also going to add some Optivar for what I think of the allergic component. ?Exam is for the most part unremarkable, extraocular muscles intact, no anterior chamber chemosis, no hyphema, no proptosis. ?

## 2021-11-25 NOTE — Assessment & Plan Note (Signed)
Pleasant 81 year old female returns, historically did well after a series of Orthovisc back in June 2022, she had an injection back in December and did well until now, now having increasing pain medial joint line left knee worse than right. ?We will refill tramadol, she will do this 3 times daily and if insufficient improvement we will consider repeat injection. ?

## 2021-11-25 NOTE — Assessment & Plan Note (Signed)
Also injected back in December, was initially doing well but now having increasing pain, this time referred to the trochanteric bursa. ?Tramadol as above will be helpful. ?

## 2021-11-25 NOTE — Progress Notes (Signed)
? ? ?  Procedures performed today:   ? ?None. ? ?Independent interpretation of notes and tests performed by another provider:  ? ?None. ? ?Brief History, Exam, Impression, and Recommendations:   ? ?Primary osteoarthritis of both knees ?Pleasant 81 year old female returns, historically did well after a series of Orthovisc back in June 2022, she had an injection back in December and did well until now, now having increasing pain medial joint line left knee worse than right. ?We will refill tramadol, she will do this 3 times daily and if insufficient improvement we will consider repeat injection. ? ?Primary osteoarthritis of left hip ?Also injected back in December, was initially doing well but now having increasing pain, this time referred to the trochanteric bursa. ?Tramadol as above will be helpful. ? ?Allergic conjunctivitis ?Bilateral itchy and heavy, vision overall normal with the exception of typical presbyopia. ?She would like recommendations for an eye provider so I will get her referred downstairs, also going to add some Optivar for what I think of the allergic component. ?Exam is for the most part unremarkable, extraocular muscles intact, no anterior chamber chemosis, no hyphema, no proptosis. ? ?Chronic processes with exacerbation and pharmacologic intervention ? ?___________________________________________ ?Ihor Austin. Benjamin Stain, M.D., ABFM., CAQSM. ?Primary Care and Sports Medicine ?[facility] ? ?Adjunct Instructor of Family Medicine  ?[employer] ?

## 2022-01-05 DIAGNOSIS — E785 Hyperlipidemia, unspecified: Secondary | ICD-10-CM | POA: Diagnosis not present

## 2022-01-05 DIAGNOSIS — E559 Vitamin D deficiency, unspecified: Secondary | ICD-10-CM | POA: Diagnosis not present

## 2022-01-05 DIAGNOSIS — I10 Essential (primary) hypertension: Secondary | ICD-10-CM | POA: Diagnosis not present

## 2022-01-05 DIAGNOSIS — E039 Hypothyroidism, unspecified: Secondary | ICD-10-CM | POA: Diagnosis not present

## 2022-03-02 DIAGNOSIS — E538 Deficiency of other specified B group vitamins: Secondary | ICD-10-CM | POA: Diagnosis not present

## 2022-03-02 DIAGNOSIS — E559 Vitamin D deficiency, unspecified: Secondary | ICD-10-CM | POA: Diagnosis not present

## 2022-03-02 DIAGNOSIS — R5383 Other fatigue: Secondary | ICD-10-CM | POA: Diagnosis not present

## 2022-03-02 DIAGNOSIS — E012 Iodine-deficiency related (endemic) goiter, unspecified: Secondary | ICD-10-CM | POA: Diagnosis not present

## 2022-03-02 DIAGNOSIS — E7841 Elevated Lipoprotein(a): Secondary | ICD-10-CM | POA: Diagnosis not present

## 2022-03-02 DIAGNOSIS — R739 Hyperglycemia, unspecified: Secondary | ICD-10-CM | POA: Diagnosis not present

## 2022-03-06 DIAGNOSIS — D2261 Melanocytic nevi of right upper limb, including shoulder: Secondary | ICD-10-CM | POA: Diagnosis not present

## 2022-03-06 DIAGNOSIS — I10 Essential (primary) hypertension: Secondary | ICD-10-CM | POA: Diagnosis not present

## 2022-03-06 DIAGNOSIS — D485 Neoplasm of uncertain behavior of skin: Secondary | ICD-10-CM | POA: Diagnosis not present

## 2022-03-06 DIAGNOSIS — E7841 Elevated Lipoprotein(a): Secondary | ICD-10-CM | POA: Diagnosis not present

## 2022-03-06 DIAGNOSIS — N2889 Other specified disorders of kidney and ureter: Secondary | ICD-10-CM | POA: Diagnosis not present

## 2022-03-06 DIAGNOSIS — R739 Hyperglycemia, unspecified: Secondary | ICD-10-CM | POA: Diagnosis not present

## 2022-03-06 DIAGNOSIS — D225 Melanocytic nevi of trunk: Secondary | ICD-10-CM | POA: Diagnosis not present

## 2022-03-06 DIAGNOSIS — L821 Other seborrheic keratosis: Secondary | ICD-10-CM | POA: Diagnosis not present

## 2022-03-23 DIAGNOSIS — D2261 Melanocytic nevi of right upper limb, including shoulder: Secondary | ICD-10-CM | POA: Diagnosis not present

## 2022-03-23 DIAGNOSIS — L905 Scar conditions and fibrosis of skin: Secondary | ICD-10-CM | POA: Diagnosis not present

## 2022-03-23 DIAGNOSIS — D2361 Other benign neoplasm of skin of right upper limb, including shoulder: Secondary | ICD-10-CM | POA: Diagnosis not present

## 2022-03-23 DIAGNOSIS — D225 Melanocytic nevi of trunk: Secondary | ICD-10-CM | POA: Diagnosis not present

## 2022-03-23 DIAGNOSIS — D235 Other benign neoplasm of skin of trunk: Secondary | ICD-10-CM | POA: Diagnosis not present

## 2022-04-01 DIAGNOSIS — H25811 Combined forms of age-related cataract, right eye: Secondary | ICD-10-CM | POA: Diagnosis not present

## 2022-04-13 DIAGNOSIS — E559 Vitamin D deficiency, unspecified: Secondary | ICD-10-CM | POA: Diagnosis not present

## 2022-04-13 DIAGNOSIS — E785 Hyperlipidemia, unspecified: Secondary | ICD-10-CM | POA: Diagnosis not present

## 2022-04-13 DIAGNOSIS — I10 Essential (primary) hypertension: Secondary | ICD-10-CM | POA: Diagnosis not present

## 2022-04-13 DIAGNOSIS — E039 Hypothyroidism, unspecified: Secondary | ICD-10-CM | POA: Diagnosis not present

## 2022-05-05 DIAGNOSIS — E559 Vitamin D deficiency, unspecified: Secondary | ICD-10-CM | POA: Diagnosis not present

## 2022-05-05 DIAGNOSIS — R7301 Impaired fasting glucose: Secondary | ICD-10-CM | POA: Diagnosis not present

## 2022-05-05 DIAGNOSIS — G629 Polyneuropathy, unspecified: Secondary | ICD-10-CM | POA: Diagnosis not present

## 2022-05-05 DIAGNOSIS — E538 Deficiency of other specified B group vitamins: Secondary | ICD-10-CM | POA: Diagnosis not present

## 2022-05-05 DIAGNOSIS — G609 Hereditary and idiopathic neuropathy, unspecified: Secondary | ICD-10-CM | POA: Diagnosis not present

## 2022-05-05 DIAGNOSIS — R2689 Other abnormalities of gait and mobility: Secondary | ICD-10-CM | POA: Diagnosis not present

## 2022-05-05 DIAGNOSIS — E531 Pyridoxine deficiency: Secondary | ICD-10-CM | POA: Diagnosis not present

## 2022-05-05 DIAGNOSIS — H8190 Unspecified disorder of vestibular function, unspecified ear: Secondary | ICD-10-CM | POA: Diagnosis not present

## 2022-05-26 DIAGNOSIS — M5417 Radiculopathy, lumbosacral region: Secondary | ICD-10-CM | POA: Diagnosis not present

## 2022-05-26 DIAGNOSIS — G603 Idiopathic progressive neuropathy: Secondary | ICD-10-CM | POA: Diagnosis not present

## 2022-06-01 DIAGNOSIS — R5383 Other fatigue: Secondary | ICD-10-CM | POA: Diagnosis not present

## 2022-06-01 DIAGNOSIS — E538 Deficiency of other specified B group vitamins: Secondary | ICD-10-CM | POA: Diagnosis not present

## 2022-06-01 DIAGNOSIS — E559 Vitamin D deficiency, unspecified: Secondary | ICD-10-CM | POA: Diagnosis not present

## 2022-06-01 DIAGNOSIS — E7841 Elevated Lipoprotein(a): Secondary | ICD-10-CM | POA: Diagnosis not present

## 2022-06-01 DIAGNOSIS — R739 Hyperglycemia, unspecified: Secondary | ICD-10-CM | POA: Diagnosis not present

## 2022-06-05 DIAGNOSIS — N2889 Other specified disorders of kidney and ureter: Secondary | ICD-10-CM | POA: Diagnosis not present

## 2022-06-05 DIAGNOSIS — E039 Hypothyroidism, unspecified: Secondary | ICD-10-CM | POA: Diagnosis not present

## 2022-06-05 DIAGNOSIS — I10 Essential (primary) hypertension: Secondary | ICD-10-CM | POA: Diagnosis not present

## 2022-06-05 DIAGNOSIS — R739 Hyperglycemia, unspecified: Secondary | ICD-10-CM | POA: Diagnosis not present

## 2022-06-08 DIAGNOSIS — R051 Acute cough: Secondary | ICD-10-CM | POA: Diagnosis not present

## 2022-06-08 DIAGNOSIS — J208 Acute bronchitis due to other specified organisms: Secondary | ICD-10-CM | POA: Diagnosis not present

## 2022-06-08 DIAGNOSIS — I10 Essential (primary) hypertension: Secondary | ICD-10-CM | POA: Diagnosis not present

## 2022-06-08 DIAGNOSIS — E559 Vitamin D deficiency, unspecified: Secondary | ICD-10-CM | POA: Diagnosis not present

## 2022-06-10 DIAGNOSIS — R051 Acute cough: Secondary | ICD-10-CM | POA: Diagnosis not present

## 2022-06-10 DIAGNOSIS — J208 Acute bronchitis due to other specified organisms: Secondary | ICD-10-CM | POA: Diagnosis not present

## 2022-06-10 DIAGNOSIS — J309 Allergic rhinitis, unspecified: Secondary | ICD-10-CM | POA: Diagnosis not present

## 2022-06-10 DIAGNOSIS — R5383 Other fatigue: Secondary | ICD-10-CM | POA: Diagnosis not present

## 2022-06-19 DIAGNOSIS — M4726 Other spondylosis with radiculopathy, lumbar region: Secondary | ICD-10-CM | POA: Diagnosis not present

## 2022-06-19 DIAGNOSIS — M4727 Other spondylosis with radiculopathy, lumbosacral region: Secondary | ICD-10-CM | POA: Diagnosis not present

## 2022-06-19 DIAGNOSIS — M5117 Intervertebral disc disorders with radiculopathy, lumbosacral region: Secondary | ICD-10-CM | POA: Diagnosis not present

## 2022-06-19 DIAGNOSIS — M5116 Intervertebral disc disorders with radiculopathy, lumbar region: Secondary | ICD-10-CM | POA: Diagnosis not present

## 2022-06-19 DIAGNOSIS — M5440 Lumbago with sciatica, unspecified side: Secondary | ICD-10-CM | POA: Diagnosis not present

## 2022-07-13 DIAGNOSIS — E559 Vitamin D deficiency, unspecified: Secondary | ICD-10-CM | POA: Diagnosis not present

## 2022-07-13 DIAGNOSIS — E785 Hyperlipidemia, unspecified: Secondary | ICD-10-CM | POA: Diagnosis not present

## 2022-07-13 DIAGNOSIS — E039 Hypothyroidism, unspecified: Secondary | ICD-10-CM | POA: Diagnosis not present

## 2022-07-13 DIAGNOSIS — I10 Essential (primary) hypertension: Secondary | ICD-10-CM | POA: Diagnosis not present

## 2022-07-16 DIAGNOSIS — R2689 Other abnormalities of gait and mobility: Secondary | ICD-10-CM | POA: Diagnosis not present

## 2022-07-16 DIAGNOSIS — G629 Polyneuropathy, unspecified: Secondary | ICD-10-CM | POA: Diagnosis not present

## 2022-07-16 DIAGNOSIS — M5417 Radiculopathy, lumbosacral region: Secondary | ICD-10-CM | POA: Diagnosis not present

## 2022-07-16 DIAGNOSIS — H8190 Unspecified disorder of vestibular function, unspecified ear: Secondary | ICD-10-CM | POA: Diagnosis not present

## 2022-07-23 ENCOUNTER — Ambulatory Visit: Payer: Medicare PPO | Admitting: Sports Medicine

## 2022-07-23 ENCOUNTER — Ambulatory Visit (INDEPENDENT_AMBULATORY_CARE_PROVIDER_SITE_OTHER): Payer: Medicare PPO

## 2022-07-23 DIAGNOSIS — M170 Bilateral primary osteoarthritis of knee: Secondary | ICD-10-CM | POA: Diagnosis not present

## 2022-07-23 DIAGNOSIS — M7061 Trochanteric bursitis, right hip: Secondary | ICD-10-CM

## 2022-07-23 DIAGNOSIS — M7062 Trochanteric bursitis, left hip: Secondary | ICD-10-CM | POA: Diagnosis not present

## 2022-07-23 MED ORDER — TRIAMCINOLONE ACETONIDE 40 MG/ML IJ SUSP
80.0000 mg | Freq: Once | INTRAMUSCULAR | Status: AC
  Administered 2022-07-23: 80 mg via INTRAMUSCULAR

## 2022-07-23 MED ORDER — TRIAMCINOLONE ACETONIDE 40 MG/ML IJ SUSP: 40.0000 mg | Freq: Once | INTRAMUSCULAR | Status: DC

## 2022-07-23 MED ORDER — TRAMADOL HCL 50 MG PO TABS: 100.0000 mg | ORAL_TABLET | Freq: Two times a day (BID) | ORAL | 3 refills | Status: DC

## 2022-07-23 NOTE — Progress Notes (Signed)
    Procedures performed today:    Procedure: Real-time Ultrasound Guided injection of the left greater trochanteric bursa Device: Samsung HS60  Verbal informed consent obtained.  Time-out conducted.  Noted no overlying erythema, induration, or other signs of local infection.  Skin prepped in a sterile fashion.  Local anesthesia: Topical Ethyl chloride.  With sterile technique and under real time ultrasound guidance: Normal-appearing greater trochanter noted, 1 cc Kenalog 40, 2 cc lidocaine, 2 cc bupivacaine injected easily Completed without difficulty  Advised to call if fevers/chills, erythema, induration, drainage, or persistent bleeding.  Images permanently stored and available for review in PACS.  Impression: Technically successful ultrasound guided injection.  Procedure: Real-time Ultrasound Guided injection of the right greater trochanteric bursa Device: Samsung HS60  Verbal informed consent obtained.  Time-out conducted.  Noted no overlying erythema, induration, or other signs of local infection.  Skin prepped in a sterile fashion.  Local anesthesia: Topical Ethyl chloride.  With sterile technique and under real time ultrasound guidance: Normal-appearing greater trochanter noted, 1 cc Kenalog 40, 2 cc lidocaine, 2 cc bupivacaine injected easily Completed without difficulty  Advised to call if fevers/chills, erythema, induration, drainage, or persistent bleeding.  Images permanently stored and available for review in PACS.  Impression: Technically successful ultrasound guided injection.  Independent interpretation of notes and tests performed by another provider:   None.  Brief History, Exam, Impression, and Recommendations:    Trochanteric bursitis of both hips Very pleasant 81 year old female, bilateral hip pain laterally over the greater trochanteric bursa. She desires bilateral injections so this was performed today, last done approximate 11 months ago, increasing  tramadol to 2 tabs twice a day, formal physical therapy, return to see me as needed.    ____________________________________________ Ihor Austin. Benjamin Stain, M.D., ABFM., CAQSM., AME. Primary Care and Sports Medicine [facility] [facility]  Adjunct Professor of Family Medicine  [employer] [facility] [employer]  Restaurant manager, fast food

## 2022-07-23 NOTE — Assessment & Plan Note (Addendum)
Very pleasant 81 year old female, bilateral hip pain laterally over the greater trochanteric bursa. She desires bilateral injections so this was performed today, last done approximate 11 months ago, increasing tramadol to 2 tabs twice a day, formal physical therapy, return to see me as needed.

## 2022-08-13 DIAGNOSIS — J208 Acute bronchitis due to other specified organisms: Secondary | ICD-10-CM | POA: Diagnosis not present

## 2022-08-13 DIAGNOSIS — I10 Essential (primary) hypertension: Secondary | ICD-10-CM | POA: Diagnosis not present

## 2022-08-13 DIAGNOSIS — E559 Vitamin D deficiency, unspecified: Secondary | ICD-10-CM | POA: Diagnosis not present

## 2022-08-13 DIAGNOSIS — E039 Hypothyroidism, unspecified: Secondary | ICD-10-CM | POA: Diagnosis not present

## 2022-09-04 ENCOUNTER — Ambulatory Visit
Admission: EM | Admit: 2022-09-04 | Discharge: 2022-09-04 | Disposition: A | Discharge status: Home / Self Care | Payer: Medicare PPO | Attending: Family Medicine | Admitting: Family Medicine

## 2022-09-04 DIAGNOSIS — H6123 Impacted cerumen, bilateral: Secondary | ICD-10-CM | POA: Diagnosis not present

## 2022-09-04 NOTE — ED Triage Notes (Signed)
Pt c/o RT ear fullness since Monday. Also has been having sinus issues/RT eye redness x 2 weeks. Unsure if related. No OTC meds tried.

## 2022-09-04 NOTE — ED Provider Notes (Signed)
[facility]    CSN: [id number] Arrival date & time: 09/04/22  0901      History   Chief Complaint Chief Complaint  Patient presents with   Ear Problem    RT    HPI Mercedes Reid is a 82 y.o. female.   Patient complains of right ear fullness without pain for four days.  She has had mild sinus congestion but no fever and feels well otherwise.  The history is provided by the patient.    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 07/31/2019   Trigger index finger of right hand 06/15/2019   Primary osteoarthritis of both knees 04/07/2018   Trochanteric bursitis of both hips 04/07/2018   Community acquired pneumonia of right middle lobe of lung 08/18/2016   Hypothyroidism (acquired) 06/26/2014   Disc degeneration, lumbosacral 02/25/2014   Fatty liver 02/25/2014   Neuralgia and neuritis 02/25/2014   Osteoarthritis 02/25/2014   Osteopenia 02/25/2014   Anxiety state 10/24/2013   H/O CHF    Thyroid disease    Hypertension    Paroxysmal atrial fibrillation (HCC)    Severe mitral regurgitation    Mitral valve prolapse     Past Surgical History:  Procedure Laterality Date   ABDOMINAL HYSTERECTOMY     COX-MAZE MICROWAVE ABLATION  02/28/2009   Dr. Ricard Dillon left side lesion set.   MITRAL VALVE REPAIR  03/01/2011   Quadrangular resection of posterior leaflet with leafet plication and 02-OV SorinMEMO 3D ring annuloplasty)                 OB History   No obstetric history on file.      Home Medications     Prior to Admission medications   Medication Sig Start Date End Date Taking? Authorizing Provider  azelastine (OPTIVAR) 0.05 % ophthalmic solution Place 2 drops into both eyes 2 (two) times daily. 11/25/21   Silverio Decamp, MD  Calcium Carbonate-Vitamin D 600-200 MG-UNIT TABS Take by mouth.    [provider]  Cholecalciferol (VITAMIN D3) 1.25 MG (50000 UT) TABS Take 1.25 mg by mouth daily at 2 PM. OTC- pt is taking 2 daily    [provider]  diclofenac Sodium (VOLTAREN) 1 % GEL Apply 4 g topically 4 (four) times daily. To both knees 05/14/21   Silverio Decamp, MD  estradiol (ESTRACE) 1 MG tablet Take 1 mg by mouth daily.      [provider]  fexofenadine (ALLEGRA ALLERGY) 180 MG tablet Take 1 tablet (180 mg total) by mouth daily. 09/17/21 10/17/21  Eliezer Lofts, FNP  hydrOXYzine (ATARAX/VISTARIL) 10 MG tablet Take 1 tablet (10 mg total) by mouth every 8 (eight) hours as needed for itching. Caution: May cause drowsiness 03/20/20   Jacqulyn Cane, MD  levothyroxine (SYNTHROID) 100 MCG tablet Take 100 mcg by mouth daily before breakfast.    [provider]  losartan (COZAAR) 100 MG tablet Take 100 mg by mouth daily. 12/20/13  [provider]  metoprolol succinate (TOPROL-XL) 25 MG 24 hr tablet Take 12.5 mg by mouth daily.    [provider]  traMADol (ULTRAM) 50 MG tablet Take 2 tablets (100 mg total) by mouth 2 (two) times daily. 07/23/22   Silverio Decamp, MD  triamterene-hydrochlorothiazide (DYAZIDE) 37.5-25 MG capsule Take 1 capsule by mouth daily.    [provider]    Family History Family History  Problem Relation Age of Onset   Heart failure Father     Social History Social History   Tobacco Use   Smoking status: Never   Smokeless tobacco: Never  Substance Use Topics   Alcohol use: No   Drug use: No     Allergies   Latex, Other, Sulfa antibiotics, and Amoxicillin-pot clavulanate   Review  of Systems Review of Systems No sore throat No cough No pleuritic pain No wheezing + nasal congestion No post-nasal drainage No sinus pain/pressure No itchy/red eyes No earache but right ear feels full No hemoptysis No SOB No fever/chills No nausea No vomiting No abdominal pain No diarrhea No urinary symptoms No skin rash No fatigue No myalgias No headache   Physical Exam Triage Vital Signs ED Triage Vitals [09/04/22 1000]  Enc Vitals Group     BP (!) 163/91     Pulse Rate (!) 57     Resp 17     Temp 98.1 F (36.7 C)     Temp Source Oral     SpO2 98 %     Weight      Height      Head Circumference      Peak Flow      Pain Score 0     Pain Loc      Pain Edu?      Excl. in [place]?    No data found.  Updated Vital Signs BP (!) 154/81 (BP Location: Right Arm)   Pulse (!) 57   Temp 98.1 F (36.7 C) (Oral)   Resp 17   SpO2 98%   Visual Acuity Right Eye Distance:   Left Eye Distance:   Bilateral Distance:    Right Eye Near:   Left Eye Near:    Bilateral Near:     Physical Exam Vitals and nursing note reviewed.  Constitutional:      General: She is not in acute distress.    Appearance: She is not ill-appearing.  HENT:     Head: Normocephalic.     Right Ear: There is impacted cerumen.     Left Ear: There is impacted cerumen.     Ears:     Comments: Post lavage, both canals and tympanic membranes appear normal.    Nose: Nose normal.     Mouth/Throat:     Mouth: Mucous membranes are moist.  Eyes:     Extraocular Movements: Extraocular movements intact.     Conjunctiva/sclera: Conjunctivae normal.     Pupils: Pupils are equal, round, and reactive to light.  Cardiovascular:     Rate and Rhythm: Normal rate.  Pulmonary:     Effort: Pulmonary effort is normal.  Musculoskeletal:     Cervical back: Neck supple.  Lymphadenopathy:     Cervical: No cervical adenopathy.  Skin:    General: Skin is warm and dry.     Findings: No rash.  Neurological:      Mental Status: She is alert and oriented to person, place, and time.      UC  Treatments / Results  Labs (all labs ordered are listed, but only abnormal results are displayed) Labs Reviewed - No data to display  EKG   Radiology No results found.  Procedures Procedures (including critical care time)  Medications Ordered in UC Medications - No data to display  Initial Impression / Assessment and Plan / UC Course  I have reviewed the triage vital signs and the nursing notes.  Pertinent labs & imaging results that were available during my care of the patient were reviewed by me and considered in my medical decision making (see chart for details).    Followup with Family Doctor as needed.  Final Clinical Impressions(s) / UC Diagnoses   Final diagnoses:  Bilateral impacted cerumen     Discharge Instructions       To prevent recurrent ear wax blockage, try the following: Soak two cotton balls with mineral oil, and gently place in each ear canal once weekly.  Leave the cotton balls in place for 10 to 20 minutes.  This will help liquefy the ear wax and aid your body's normal elimination process.  If applicable, do not use a hearing aid for 8 hours overnight.  Have your ears cleaned by a health professional every 6 to 12 months.  Avoid using "Q-tips" and ear wax softening solutions     ED Prescriptions   None       Kandra Nicolas, MD 09/06/22 [phone]

## 2022-09-04 NOTE — Discharge Instructions (Signed)
  To prevent recurrent ear wax blockage, try the following: Soak two cotton balls with mineral oil, and gently place in each ear canal once weekly.  Leave the cotton balls in place for 10 to 20 minutes.  This will help liquefy the ear wax and aid your body's normal elimination process.  If applicable, do not use a hearing aid for 8 hours overnight.  Have your ears cleaned by a health professional every 6 to 12 months.  Avoid using "Q-tips" and ear wax softening solutions  

## 2022-09-07 DIAGNOSIS — D2239 Melanocytic nevi of other parts of face: Secondary | ICD-10-CM | POA: Diagnosis not present

## 2022-09-07 DIAGNOSIS — I10 Essential (primary) hypertension: Secondary | ICD-10-CM | POA: Diagnosis not present

## 2022-09-07 DIAGNOSIS — L820 Inflamed seborrheic keratosis: Secondary | ICD-10-CM | POA: Diagnosis not present

## 2022-09-07 DIAGNOSIS — D229 Melanocytic nevi, unspecified: Secondary | ICD-10-CM | POA: Diagnosis not present

## 2022-09-07 DIAGNOSIS — D485 Neoplasm of uncertain behavior of skin: Secondary | ICD-10-CM | POA: Diagnosis not present

## 2022-09-07 DIAGNOSIS — L570 Actinic keratosis: Secondary | ICD-10-CM | POA: Diagnosis not present

## 2022-09-07 DIAGNOSIS — L821 Other seborrheic keratosis: Secondary | ICD-10-CM | POA: Diagnosis not present

## 2022-09-09 DIAGNOSIS — E7841 Elevated Lipoprotein(a): Secondary | ICD-10-CM | POA: Diagnosis not present

## 2022-09-09 DIAGNOSIS — E785 Hyperlipidemia, unspecified: Secondary | ICD-10-CM | POA: Diagnosis not present

## 2022-09-09 DIAGNOSIS — I10 Essential (primary) hypertension: Secondary | ICD-10-CM | POA: Diagnosis not present

## 2022-09-09 DIAGNOSIS — E559 Vitamin D deficiency, unspecified: Secondary | ICD-10-CM | POA: Diagnosis not present

## 2022-09-09 DIAGNOSIS — R5383 Other fatigue: Secondary | ICD-10-CM | POA: Diagnosis not present

## 2022-09-09 DIAGNOSIS — K591 Functional diarrhea: Secondary | ICD-10-CM | POA: Diagnosis not present

## 2022-09-09 DIAGNOSIS — R739 Hyperglycemia, unspecified: Secondary | ICD-10-CM | POA: Diagnosis not present

## 2022-09-09 DIAGNOSIS — R109 Unspecified abdominal pain: Secondary | ICD-10-CM | POA: Diagnosis not present

## 2022-09-09 DIAGNOSIS — E012 Iodine-deficiency related (endemic) goiter, unspecified: Secondary | ICD-10-CM | POA: Diagnosis not present

## 2022-09-09 DIAGNOSIS — E538 Deficiency of other specified B group vitamins: Secondary | ICD-10-CM | POA: Diagnosis not present

## 2022-09-29 DIAGNOSIS — E7841 Elevated Lipoprotein(a): Secondary | ICD-10-CM | POA: Diagnosis not present

## 2022-09-29 DIAGNOSIS — E039 Hypothyroidism, unspecified: Secondary | ICD-10-CM | POA: Diagnosis not present

## 2022-09-29 DIAGNOSIS — I10 Essential (primary) hypertension: Secondary | ICD-10-CM | POA: Diagnosis not present

## 2022-09-29 DIAGNOSIS — N2889 Other specified disorders of kidney and ureter: Secondary | ICD-10-CM | POA: Diagnosis not present

## 2022-10-12 DIAGNOSIS — E559 Vitamin D deficiency, unspecified: Secondary | ICD-10-CM | POA: Diagnosis not present

## 2022-10-12 DIAGNOSIS — E039 Hypothyroidism, unspecified: Secondary | ICD-10-CM | POA: Diagnosis not present

## 2022-10-12 DIAGNOSIS — I10 Essential (primary) hypertension: Secondary | ICD-10-CM | POA: Diagnosis not present

## 2022-10-12 DIAGNOSIS — E785 Hyperlipidemia, unspecified: Secondary | ICD-10-CM | POA: Diagnosis not present

## 2022-12-24 ENCOUNTER — Ambulatory Visit: Payer: Medicare PPO | Admitting: Sports Medicine

## 2022-12-24 ENCOUNTER — Ambulatory Visit (INDEPENDENT_AMBULATORY_CARE_PROVIDER_SITE_OTHER): Payer: Medicare PPO

## 2022-12-24 DIAGNOSIS — M4802 Spinal stenosis, cervical region: Secondary | ICD-10-CM | POA: Diagnosis not present

## 2022-12-24 DIAGNOSIS — M7061 Trochanteric bursitis, right hip: Secondary | ICD-10-CM | POA: Diagnosis not present

## 2022-12-24 DIAGNOSIS — M5412 Radiculopathy, cervical region: Secondary | ICD-10-CM | POA: Diagnosis not present

## 2022-12-24 DIAGNOSIS — M7062 Trochanteric bursitis, left hip: Secondary | ICD-10-CM

## 2022-12-24 DIAGNOSIS — M170 Bilateral primary osteoarthritis of knee: Secondary | ICD-10-CM | POA: Diagnosis not present

## 2022-12-24 DIAGNOSIS — M542 Cervicalgia: Secondary | ICD-10-CM | POA: Diagnosis not present

## 2022-12-24 MED ORDER — TRAMADOL HCL 50 MG PO TABS: 50.0000 mg | ORAL_TABLET | Freq: Three times a day (TID) | ORAL | 3 refills | Status: DC | PRN

## 2022-12-24 NOTE — Assessment & Plan Note (Signed)
Several months of pain left periscapular, left neck with radiation over the left deltoid consistent with a left C5 radiculopathy. We will start conservatively, adding x-rays, formal PT, return to see me in 6 to 8 weeks, will consider MRI and epidural if not better.

## 2022-12-24 NOTE — Assessment & Plan Note (Signed)
Mercedes Reid has become quite weak, she has not had any falls but she does not trust her legs, she has great difficulty getting from a seated position. On exam she is weak with all motions of the hips and legs. I think she is debilitated, we will add some aggressive physical therapy for her neck, hips and back.

## 2022-12-24 NOTE — Progress Notes (Signed)
    Procedures performed today:    None.  Independent interpretation of notes and tests performed by another provider:   None.  Brief History, Exam, Impression, and Recommendations:    Radiculitis of left cervical region Several months of pain left periscapular, left neck with radiation over the left deltoid consistent with a left C5 radiculopathy. We will start conservatively, adding x-rays, formal PT, return to see me in 6 to 8 weeks, will consider MRI and epidural if not better.  Trochanteric bursitis of both hips Yehudis has become quite weak, she has not had any falls but she does not trust her legs, she has great difficulty getting from a seated position. On exam she is weak with all motions of the hips and legs. I think she is debilitated, we will add some aggressive physical therapy for her neck, hips and back.    ____________________________________________ Ihor Austin. Benjamin Stain, M.D., ABFM., CAQSM., AME. Primary Care and Sports Medicine [place] [facility]  Adjunct Professor of Family Medicine  [place] [employer] [facility] [employer]  Restaurant manager, fast food

## 2022-12-30 DIAGNOSIS — M7062 Trochanteric bursitis, left hip: Secondary | ICD-10-CM | POA: Diagnosis not present

## 2022-12-30 DIAGNOSIS — R5383 Other fatigue: Secondary | ICD-10-CM | POA: Diagnosis not present

## 2022-12-30 DIAGNOSIS — M7061 Trochanteric bursitis, right hip: Secondary | ICD-10-CM | POA: Diagnosis not present

## 2022-12-30 DIAGNOSIS — E538 Deficiency of other specified B group vitamins: Secondary | ICD-10-CM | POA: Diagnosis not present

## 2022-12-30 DIAGNOSIS — E559 Vitamin D deficiency, unspecified: Secondary | ICD-10-CM | POA: Diagnosis not present

## 2022-12-30 DIAGNOSIS — R739 Hyperglycemia, unspecified: Secondary | ICD-10-CM | POA: Diagnosis not present

## 2022-12-30 DIAGNOSIS — E7841 Elevated Lipoprotein(a): Secondary | ICD-10-CM | POA: Diagnosis not present

## 2023-01-05 DIAGNOSIS — I10 Essential (primary) hypertension: Secondary | ICD-10-CM | POA: Diagnosis not present

## 2023-01-05 DIAGNOSIS — N2889 Other specified disorders of kidney and ureter: Secondary | ICD-10-CM | POA: Diagnosis not present

## 2023-01-05 DIAGNOSIS — E039 Hypothyroidism, unspecified: Secondary | ICD-10-CM | POA: Diagnosis not present

## 2023-01-05 DIAGNOSIS — E7841 Elevated Lipoprotein(a): Secondary | ICD-10-CM | POA: Diagnosis not present

## 2023-01-06 DIAGNOSIS — M7062 Trochanteric bursitis, left hip: Secondary | ICD-10-CM | POA: Diagnosis not present

## 2023-01-06 DIAGNOSIS — M7061 Trochanteric bursitis, right hip: Secondary | ICD-10-CM | POA: Diagnosis not present

## 2023-01-06 DIAGNOSIS — E782 Mixed hyperlipidemia: Secondary | ICD-10-CM | POA: Diagnosis not present

## 2023-01-06 DIAGNOSIS — E039 Hypothyroidism, unspecified: Secondary | ICD-10-CM | POA: Diagnosis not present

## 2023-01-06 DIAGNOSIS — I10 Essential (primary) hypertension: Secondary | ICD-10-CM | POA: Diagnosis not present

## 2023-01-06 DIAGNOSIS — I341 Nonrheumatic mitral (valve) prolapse: Secondary | ICD-10-CM | POA: Diagnosis not present

## 2023-01-08 DIAGNOSIS — M7062 Trochanteric bursitis, left hip: Secondary | ICD-10-CM | POA: Diagnosis not present

## 2023-01-08 DIAGNOSIS — M7061 Trochanteric bursitis, right hip: Secondary | ICD-10-CM | POA: Diagnosis not present

## 2023-01-11 DIAGNOSIS — H8190 Unspecified disorder of vestibular function, unspecified ear: Secondary | ICD-10-CM | POA: Diagnosis not present

## 2023-01-11 DIAGNOSIS — E559 Vitamin D deficiency, unspecified: Secondary | ICD-10-CM | POA: Diagnosis not present

## 2023-01-11 DIAGNOSIS — R2689 Other abnormalities of gait and mobility: Secondary | ICD-10-CM | POA: Diagnosis not present

## 2023-01-11 DIAGNOSIS — G589 Mononeuropathy, unspecified: Secondary | ICD-10-CM | POA: Diagnosis not present

## 2023-01-11 DIAGNOSIS — E538 Deficiency of other specified B group vitamins: Secondary | ICD-10-CM | POA: Diagnosis not present

## 2023-01-11 DIAGNOSIS — M5417 Radiculopathy, lumbosacral region: Secondary | ICD-10-CM | POA: Diagnosis not present

## 2023-01-11 DIAGNOSIS — G629 Polyneuropathy, unspecified: Secondary | ICD-10-CM | POA: Diagnosis not present

## 2023-01-11 DIAGNOSIS — G609 Hereditary and idiopathic neuropathy, unspecified: Secondary | ICD-10-CM | POA: Diagnosis not present

## 2023-01-11 DIAGNOSIS — E531 Pyridoxine deficiency: Secondary | ICD-10-CM | POA: Diagnosis not present

## 2023-01-15 DIAGNOSIS — M7062 Trochanteric bursitis, left hip: Secondary | ICD-10-CM | POA: Diagnosis not present

## 2023-01-15 DIAGNOSIS — M25612 Stiffness of left shoulder, not elsewhere classified: Secondary | ICD-10-CM | POA: Diagnosis not present

## 2023-01-15 DIAGNOSIS — G8929 Other chronic pain: Secondary | ICD-10-CM | POA: Diagnosis not present

## 2023-01-15 DIAGNOSIS — M25512 Pain in left shoulder: Secondary | ICD-10-CM | POA: Diagnosis not present

## 2023-01-15 DIAGNOSIS — R29898 Other symptoms and signs involving the musculoskeletal system: Secondary | ICD-10-CM | POA: Diagnosis not present

## 2023-01-15 DIAGNOSIS — M7061 Trochanteric bursitis, right hip: Secondary | ICD-10-CM | POA: Diagnosis not present

## 2023-01-19 DIAGNOSIS — R600 Localized edema: Secondary | ICD-10-CM | POA: Diagnosis not present

## 2023-01-19 DIAGNOSIS — E039 Hypothyroidism, unspecified: Secondary | ICD-10-CM | POA: Diagnosis not present

## 2023-01-19 DIAGNOSIS — I10 Essential (primary) hypertension: Secondary | ICD-10-CM | POA: Diagnosis not present

## 2023-01-19 DIAGNOSIS — M170 Bilateral primary osteoarthritis of knee: Secondary | ICD-10-CM | POA: Diagnosis not present

## 2023-01-20 DIAGNOSIS — M7061 Trochanteric bursitis, right hip: Secondary | ICD-10-CM | POA: Diagnosis not present

## 2023-01-20 DIAGNOSIS — M7062 Trochanteric bursitis, left hip: Secondary | ICD-10-CM | POA: Diagnosis not present

## 2023-01-27 DIAGNOSIS — M7062 Trochanteric bursitis, left hip: Secondary | ICD-10-CM | POA: Diagnosis not present

## 2023-01-27 DIAGNOSIS — M7061 Trochanteric bursitis, right hip: Secondary | ICD-10-CM | POA: Diagnosis not present

## 2023-01-29 DIAGNOSIS — M7062 Trochanteric bursitis, left hip: Secondary | ICD-10-CM | POA: Diagnosis not present

## 2023-01-29 DIAGNOSIS — M7061 Trochanteric bursitis, right hip: Secondary | ICD-10-CM | POA: Diagnosis not present

## 2023-02-01 DIAGNOSIS — G629 Polyneuropathy, unspecified: Secondary | ICD-10-CM | POA: Diagnosis not present

## 2023-02-01 DIAGNOSIS — M5417 Radiculopathy, lumbosacral region: Secondary | ICD-10-CM | POA: Diagnosis not present

## 2023-02-02 DIAGNOSIS — M7061 Trochanteric bursitis, right hip: Secondary | ICD-10-CM | POA: Diagnosis not present

## 2023-02-02 DIAGNOSIS — M7062 Trochanteric bursitis, left hip: Secondary | ICD-10-CM | POA: Diagnosis not present

## 2023-02-05 DIAGNOSIS — M7062 Trochanteric bursitis, left hip: Secondary | ICD-10-CM | POA: Diagnosis not present

## 2023-02-05 DIAGNOSIS — M7061 Trochanteric bursitis, right hip: Secondary | ICD-10-CM | POA: Diagnosis not present

## 2023-02-09 DIAGNOSIS — M160 Bilateral primary osteoarthritis of hip: Secondary | ICD-10-CM | POA: Diagnosis not present

## 2023-02-09 DIAGNOSIS — I10 Essential (primary) hypertension: Secondary | ICD-10-CM | POA: Diagnosis not present

## 2023-02-09 DIAGNOSIS — R739 Hyperglycemia, unspecified: Secondary | ICD-10-CM | POA: Diagnosis not present

## 2023-02-09 DIAGNOSIS — E039 Hypothyroidism, unspecified: Secondary | ICD-10-CM | POA: Diagnosis not present

## 2023-02-11 DIAGNOSIS — M7061 Trochanteric bursitis, right hip: Secondary | ICD-10-CM | POA: Diagnosis not present

## 2023-02-11 DIAGNOSIS — M7062 Trochanteric bursitis, left hip: Secondary | ICD-10-CM | POA: Diagnosis not present

## 2023-02-16 DIAGNOSIS — M7062 Trochanteric bursitis, left hip: Secondary | ICD-10-CM | POA: Diagnosis not present

## 2023-02-16 DIAGNOSIS — M7061 Trochanteric bursitis, right hip: Secondary | ICD-10-CM | POA: Diagnosis not present

## 2023-02-18 DIAGNOSIS — M7062 Trochanteric bursitis, left hip: Secondary | ICD-10-CM | POA: Diagnosis not present

## 2023-02-18 DIAGNOSIS — M7061 Trochanteric bursitis, right hip: Secondary | ICD-10-CM | POA: Diagnosis not present

## 2023-02-23 ENCOUNTER — Ambulatory Visit: Payer: Medicare PPO | Admitting: Sports Medicine

## 2023-02-23 DIAGNOSIS — M7061 Trochanteric bursitis, right hip: Secondary | ICD-10-CM | POA: Diagnosis not present

## 2023-02-23 DIAGNOSIS — M7062 Trochanteric bursitis, left hip: Secondary | ICD-10-CM | POA: Diagnosis not present

## 2023-02-24 ENCOUNTER — Ambulatory Visit: Payer: Medicare PPO | Admitting: Sports Medicine

## 2023-02-24 DIAGNOSIS — M5412 Radiculopathy, cervical region: Secondary | ICD-10-CM

## 2023-02-24 DIAGNOSIS — M170 Bilateral primary osteoarthritis of knee: Secondary | ICD-10-CM

## 2023-02-24 NOTE — Assessment & Plan Note (Signed)
Mercedes Reid is having recurrence of knee pain left worse than right medial joint line, she is having some giving way. She did well after a series of Orthovisc in June 2022, she had an injection in December 2022. She has done well historically with tramadol. If she does not do significantly better after a good 4 to 6 weeks of home PT for her knees I will do bilateral steroid injections.

## 2023-02-24 NOTE — Progress Notes (Signed)
    Procedures performed today:    Procedure:  Injection of left-sided trapezial and paracervical trigger point x 2 Consent obtained and verified. Time-out conducted. Noted no overlying erythema, induration, or other signs of local infection. Skin prepped in a sterile fashion. Topical analgesic spray: Ethyl chloride. Completed without difficulty. Meds: 1 cc kenalog 40, 2 cc lidocaine spread out between the 2 trigger points Advised to call if fevers/chills, erythema, induration, drainage, or persistent bleeding.  Independent interpretation of notes and tests performed by another provider:   None.  Brief History, Exam, Impression, and Recommendations:    Radiculitis of left cervical region This is a very pleasant 82 year old female, she presented about 2 months ago with some left-sided periscapular neck pain radiation to the left deltoid consistent with a C5 radiculopathy, we started formal PT, got some x-rays, she has improved dramatically. She still has some discomfort with terminal left rotation. She feels the pain along her trapezius. I did trigger point injections along the trapezius today. She will continue her home conditioning and she can return to see me as needed for this.  Primary osteoarthritis of both knees Lidya is having recurrence of knee pain left worse than right medial joint line, she is having some giving way. She did well after a series of Orthovisc in June 2022, she had an injection in December 2022. She has done well historically with tramadol. If she does not do significantly better after a good 4 to 6 weeks of home PT for her knees I will do bilateral steroid injections.    ____________________________________________ Ihor Austin. Benjamin Stain, M.D., ABFM., CAQSM., AME. Primary Care and Sports Medicine  [facility]  Adjunct Professor of Family Medicine  [place] [employer] [facility] [employer]  Stage manager

## 2023-02-24 NOTE — Assessment & Plan Note (Signed)
This is a very pleasant 82 year old female, she presented about 2 months ago with some left-sided periscapular neck pain radiation to the left deltoid consistent with a C5 radiculopathy, we started formal PT, got some x-rays, she has improved dramatically. She still has some discomfort with terminal left rotation. She feels the pain along her trapezius. I did trigger point injections along the trapezius today. She will continue her home conditioning and she can return to see me as needed for this.

## 2023-02-25 DIAGNOSIS — M7061 Trochanteric bursitis, right hip: Secondary | ICD-10-CM | POA: Diagnosis not present

## 2023-02-25 DIAGNOSIS — M7062 Trochanteric bursitis, left hip: Secondary | ICD-10-CM | POA: Diagnosis not present

## 2023-03-05 DIAGNOSIS — H43313 Vitreous membranes and strands, bilateral: Secondary | ICD-10-CM | POA: Diagnosis not present

## 2023-03-05 DIAGNOSIS — H524 Presbyopia: Secondary | ICD-10-CM | POA: Diagnosis not present

## 2023-03-08 DIAGNOSIS — M7062 Trochanteric bursitis, left hip: Secondary | ICD-10-CM | POA: Diagnosis not present

## 2023-03-08 DIAGNOSIS — M7061 Trochanteric bursitis, right hip: Secondary | ICD-10-CM | POA: Diagnosis not present

## 2023-03-10 DIAGNOSIS — M7061 Trochanteric bursitis, right hip: Secondary | ICD-10-CM | POA: Diagnosis not present

## 2023-03-10 DIAGNOSIS — M7062 Trochanteric bursitis, left hip: Secondary | ICD-10-CM | POA: Diagnosis not present

## 2023-03-15 ENCOUNTER — Other Ambulatory Visit (HOSPITAL_COMMUNITY): Payer: Self-pay | Admitting: Cardiology

## 2023-03-15 DIAGNOSIS — R079 Chest pain, unspecified: Secondary | ICD-10-CM

## 2023-03-16 DIAGNOSIS — R079 Chest pain, unspecified: Secondary | ICD-10-CM | POA: Diagnosis not present

## 2023-03-16 DIAGNOSIS — I10 Essential (primary) hypertension: Secondary | ICD-10-CM | POA: Diagnosis not present

## 2023-03-16 DIAGNOSIS — M160 Bilateral primary osteoarthritis of hip: Secondary | ICD-10-CM | POA: Diagnosis not present

## 2023-03-16 DIAGNOSIS — E039 Hypothyroidism, unspecified: Secondary | ICD-10-CM | POA: Diagnosis not present

## 2023-03-23 DIAGNOSIS — M7061 Trochanteric bursitis, right hip: Secondary | ICD-10-CM | POA: Diagnosis not present

## 2023-03-23 DIAGNOSIS — M7062 Trochanteric bursitis, left hip: Secondary | ICD-10-CM | POA: Diagnosis not present

## 2023-03-24 ENCOUNTER — Ambulatory Visit (HOSPITAL_COMMUNITY)
Admission: RE | Admit: 2023-03-24 | Discharge: 2023-03-24 | Disposition: A | Discharge status: Home / Self Care | Payer: Medicare PPO | Source: Ambulatory Visit | Attending: Cardiology | Admitting: Cardiology

## 2023-03-24 DIAGNOSIS — R079 Chest pain, unspecified: Secondary | ICD-10-CM | POA: Insufficient documentation

## 2023-03-24 DIAGNOSIS — R072 Precordial pain: Secondary | ICD-10-CM | POA: Diagnosis not present

## 2023-03-24 MED ORDER — REGADENOSON 0.4 MG/5ML IV SOLN
INTRAVENOUS | Status: AC
  Filled 2023-03-24: qty 5

## 2023-03-24 MED ORDER — REGADENOSON 0.4 MG/5ML IV SOLN
0.4000 mg | Freq: Once | INTRAVENOUS | Status: AC
Start: 2023-03-24 — End: 2023-03-24
  Administered 2023-03-24: 0.4 mg via INTRAVENOUS

## 2023-03-24 MED ORDER — TECHNETIUM TC 99M TETROFOSMIN IV KIT
10.9000 | PACK | Freq: Once | INTRAVENOUS | Status: AC | PRN
  Administered 2023-03-24: 10.9 via INTRAVENOUS

## 2023-03-24 MED ORDER — TECHNETIUM TC 99M TETROFOSMIN IV KIT
31.7000 | PACK | Freq: Once | INTRAVENOUS | Status: AC | PRN
  Administered 2023-03-24: 31.7 via INTRAVENOUS

## 2023-03-25 DIAGNOSIS — M7062 Trochanteric bursitis, left hip: Secondary | ICD-10-CM | POA: Diagnosis not present

## 2023-03-25 DIAGNOSIS — M7061 Trochanteric bursitis, right hip: Secondary | ICD-10-CM | POA: Diagnosis not present

## 2023-03-25 DIAGNOSIS — R29898 Other symptoms and signs involving the musculoskeletal system: Secondary | ICD-10-CM | POA: Diagnosis not present

## 2023-03-25 DIAGNOSIS — G8929 Other chronic pain: Secondary | ICD-10-CM | POA: Diagnosis not present

## 2023-03-25 DIAGNOSIS — M25612 Stiffness of left shoulder, not elsewhere classified: Secondary | ICD-10-CM | POA: Diagnosis not present

## 2023-03-25 DIAGNOSIS — M25512 Pain in left shoulder: Secondary | ICD-10-CM | POA: Diagnosis not present

## 2023-03-30 DIAGNOSIS — M25612 Stiffness of left shoulder, not elsewhere classified: Secondary | ICD-10-CM | POA: Diagnosis not present

## 2023-03-30 DIAGNOSIS — M25512 Pain in left shoulder: Secondary | ICD-10-CM | POA: Diagnosis not present

## 2023-03-30 DIAGNOSIS — G8929 Other chronic pain: Secondary | ICD-10-CM | POA: Diagnosis not present

## 2023-03-30 DIAGNOSIS — R29898 Other symptoms and signs involving the musculoskeletal system: Secondary | ICD-10-CM | POA: Diagnosis not present

## 2023-03-30 DIAGNOSIS — M7061 Trochanteric bursitis, right hip: Secondary | ICD-10-CM | POA: Diagnosis not present

## 2023-03-30 DIAGNOSIS — M7062 Trochanteric bursitis, left hip: Secondary | ICD-10-CM | POA: Diagnosis not present

## 2023-03-31 DIAGNOSIS — E7841 Elevated Lipoprotein(a): Secondary | ICD-10-CM | POA: Diagnosis not present

## 2023-03-31 DIAGNOSIS — R5383 Other fatigue: Secondary | ICD-10-CM | POA: Diagnosis not present

## 2023-03-31 DIAGNOSIS — E538 Deficiency of other specified B group vitamins: Secondary | ICD-10-CM | POA: Diagnosis not present

## 2023-03-31 DIAGNOSIS — E559 Vitamin D deficiency, unspecified: Secondary | ICD-10-CM | POA: Diagnosis not present

## 2023-03-31 DIAGNOSIS — R739 Hyperglycemia, unspecified: Secondary | ICD-10-CM | POA: Diagnosis not present

## 2023-04-01 DIAGNOSIS — M7061 Trochanteric bursitis, right hip: Secondary | ICD-10-CM | POA: Diagnosis not present

## 2023-04-01 DIAGNOSIS — M25612 Stiffness of left shoulder, not elsewhere classified: Secondary | ICD-10-CM | POA: Diagnosis not present

## 2023-04-01 DIAGNOSIS — M25512 Pain in left shoulder: Secondary | ICD-10-CM | POA: Diagnosis not present

## 2023-04-01 DIAGNOSIS — M7062 Trochanteric bursitis, left hip: Secondary | ICD-10-CM | POA: Diagnosis not present

## 2023-04-01 DIAGNOSIS — G8929 Other chronic pain: Secondary | ICD-10-CM | POA: Diagnosis not present

## 2023-04-02 DIAGNOSIS — I10 Essential (primary) hypertension: Secondary | ICD-10-CM | POA: Diagnosis not present

## 2023-04-02 DIAGNOSIS — R079 Chest pain, unspecified: Secondary | ICD-10-CM | POA: Diagnosis not present

## 2023-04-02 DIAGNOSIS — E785 Hyperlipidemia, unspecified: Secondary | ICD-10-CM | POA: Diagnosis not present

## 2023-04-02 DIAGNOSIS — K219 Gastro-esophageal reflux disease without esophagitis: Secondary | ICD-10-CM | POA: Diagnosis not present

## 2023-04-06 DIAGNOSIS — M7062 Trochanteric bursitis, left hip: Secondary | ICD-10-CM | POA: Diagnosis not present

## 2023-04-06 DIAGNOSIS — M7061 Trochanteric bursitis, right hip: Secondary | ICD-10-CM | POA: Diagnosis not present

## 2023-04-07 ENCOUNTER — Ambulatory Visit: Payer: Medicare PPO | Admitting: Sports Medicine

## 2023-04-09 DIAGNOSIS — M7061 Trochanteric bursitis, right hip: Secondary | ICD-10-CM | POA: Diagnosis not present

## 2023-04-09 DIAGNOSIS — M7062 Trochanteric bursitis, left hip: Secondary | ICD-10-CM | POA: Diagnosis not present

## 2023-04-12 ENCOUNTER — Ambulatory Visit: Payer: Medicare PPO | Admitting: Sports Medicine

## 2023-04-12 ENCOUNTER — Encounter: Payer: Self-pay | Admitting: Sports Medicine

## 2023-04-12 DIAGNOSIS — M5412 Radiculopathy, cervical region: Secondary | ICD-10-CM

## 2023-04-12 MED ORDER — GABAPENTIN 300 MG PO CAPS
ORAL_CAPSULE | ORAL | 3 refills | Status: AC
Start: 2023-04-12 — End: ?

## 2023-04-12 NOTE — Assessment & Plan Note (Signed)
This pleasant 82 year old female returns, she has a left-sided periscapular neck pain, much better with trigger point injections at the last visit, these were done along the left trapezius. She has had dramatic improvement of her neck pain but still has some periscapular discomfort. 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 Her pain is predominantly nocturnal so we will add gabapentin, return to see me in 6 weeks.

## 2023-04-12 NOTE — Progress Notes (Signed)
    Procedures performed today:    None.  Independent interpretation of notes and tests performed by another provider:   None.  Brief History, Exam, Impression, and Recommendations:    Radiculitis of left cervical region This pleasant 82 year old female returns, she has a left-sided periscapular neck pain, much better with trigger point injections at the last visit, these were done along the left trapezius. She has had dramatic improvement of her neck pain but still has some periscapular discomfort. 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 Her pain is predominantly nocturnal so we will add gabapentin, return to see me in 6 weeks.  Chronic process not at goal with pharmacologic intervention  ____________________________________________ Ihor Austin. Benjamin Stain, M.D., ABFM., CAQSM., AME. Primary Care and Sports Medicine [place] [facility]  Adjunct Professor of Family Medicine  [place] [employer] [facility] [employer]  Restaurant manager, fast food

## 2023-04-13 DIAGNOSIS — M7061 Trochanteric bursitis, right hip: Secondary | ICD-10-CM | POA: Diagnosis not present

## 2023-04-13 DIAGNOSIS — M7062 Trochanteric bursitis, left hip: Secondary | ICD-10-CM | POA: Diagnosis not present

## 2023-04-15 DIAGNOSIS — M25612 Stiffness of left shoulder, not elsewhere classified: Secondary | ICD-10-CM | POA: Diagnosis not present

## 2023-04-15 DIAGNOSIS — G8929 Other chronic pain: Secondary | ICD-10-CM | POA: Diagnosis not present

## 2023-04-15 DIAGNOSIS — R29898 Other symptoms and signs involving the musculoskeletal system: Secondary | ICD-10-CM | POA: Diagnosis not present

## 2023-04-15 DIAGNOSIS — M25512 Pain in left shoulder: Secondary | ICD-10-CM | POA: Diagnosis not present

## 2023-04-15 DIAGNOSIS — M7062 Trochanteric bursitis, left hip: Secondary | ICD-10-CM | POA: Diagnosis not present

## 2023-04-15 DIAGNOSIS — M7061 Trochanteric bursitis, right hip: Secondary | ICD-10-CM | POA: Diagnosis not present

## 2023-04-22 DIAGNOSIS — M25652 Stiffness of left hip, not elsewhere classified: Secondary | ICD-10-CM | POA: Diagnosis not present

## 2023-04-22 DIAGNOSIS — K219 Gastro-esophageal reflux disease without esophagitis: Secondary | ICD-10-CM | POA: Diagnosis not present

## 2023-04-22 DIAGNOSIS — G8929 Other chronic pain: Secondary | ICD-10-CM | POA: Diagnosis not present

## 2023-04-22 DIAGNOSIS — R29898 Other symptoms and signs involving the musculoskeletal system: Secondary | ICD-10-CM | POA: Diagnosis not present

## 2023-04-22 DIAGNOSIS — M25651 Stiffness of right hip, not elsewhere classified: Secondary | ICD-10-CM | POA: Diagnosis not present

## 2023-04-22 DIAGNOSIS — Z9889 Other specified postprocedural states: Secondary | ICD-10-CM | POA: Diagnosis not present

## 2023-04-22 DIAGNOSIS — M25512 Pain in left shoulder: Secondary | ICD-10-CM | POA: Diagnosis not present

## 2023-04-22 DIAGNOSIS — I10 Essential (primary) hypertension: Secondary | ICD-10-CM | POA: Diagnosis not present

## 2023-04-22 DIAGNOSIS — R739 Hyperglycemia, unspecified: Secondary | ICD-10-CM | POA: Diagnosis not present

## 2023-04-22 DIAGNOSIS — M25612 Stiffness of left shoulder, not elsewhere classified: Secondary | ICD-10-CM | POA: Diagnosis not present

## 2023-04-22 DIAGNOSIS — M160 Bilateral primary osteoarthritis of hip: Secondary | ICD-10-CM | POA: Diagnosis not present

## 2023-04-23 ENCOUNTER — Other Ambulatory Visit: Payer: Self-pay | Admitting: Sports Medicine

## 2023-04-23 DIAGNOSIS — H1013 Acute atopic conjunctivitis, bilateral: Secondary | ICD-10-CM

## 2023-04-27 DIAGNOSIS — M7061 Trochanteric bursitis, right hip: Secondary | ICD-10-CM | POA: Diagnosis not present

## 2023-04-27 DIAGNOSIS — M25612 Stiffness of left shoulder, not elsewhere classified: Secondary | ICD-10-CM | POA: Diagnosis not present

## 2023-04-27 DIAGNOSIS — M25512 Pain in left shoulder: Secondary | ICD-10-CM | POA: Diagnosis not present

## 2023-04-27 DIAGNOSIS — M25651 Stiffness of right hip, not elsewhere classified: Secondary | ICD-10-CM | POA: Diagnosis not present

## 2023-04-27 DIAGNOSIS — G8929 Other chronic pain: Secondary | ICD-10-CM | POA: Diagnosis not present

## 2023-04-27 DIAGNOSIS — R29898 Other symptoms and signs involving the musculoskeletal system: Secondary | ICD-10-CM | POA: Diagnosis not present

## 2023-04-27 DIAGNOSIS — M7062 Trochanteric bursitis, left hip: Secondary | ICD-10-CM | POA: Diagnosis not present

## 2023-04-29 DIAGNOSIS — M7062 Trochanteric bursitis, left hip: Secondary | ICD-10-CM | POA: Diagnosis not present

## 2023-04-29 DIAGNOSIS — R29898 Other symptoms and signs involving the musculoskeletal system: Secondary | ICD-10-CM | POA: Diagnosis not present

## 2023-04-29 DIAGNOSIS — M25652 Stiffness of left hip, not elsewhere classified: Secondary | ICD-10-CM | POA: Diagnosis not present

## 2023-04-29 DIAGNOSIS — M25612 Stiffness of left shoulder, not elsewhere classified: Secondary | ICD-10-CM | POA: Diagnosis not present

## 2023-04-29 DIAGNOSIS — M7061 Trochanteric bursitis, right hip: Secondary | ICD-10-CM | POA: Diagnosis not present

## 2023-04-29 DIAGNOSIS — M25512 Pain in left shoulder: Secondary | ICD-10-CM | POA: Diagnosis not present

## 2023-04-29 DIAGNOSIS — M25651 Stiffness of right hip, not elsewhere classified: Secondary | ICD-10-CM | POA: Diagnosis not present

## 2023-04-29 DIAGNOSIS — G8929 Other chronic pain: Secondary | ICD-10-CM | POA: Diagnosis not present

## 2023-05-06 DIAGNOSIS — M25612 Stiffness of left shoulder, not elsewhere classified: Secondary | ICD-10-CM | POA: Diagnosis not present

## 2023-05-06 DIAGNOSIS — M25512 Pain in left shoulder: Secondary | ICD-10-CM | POA: Diagnosis not present

## 2023-05-06 DIAGNOSIS — M7061 Trochanteric bursitis, right hip: Secondary | ICD-10-CM | POA: Diagnosis not present

## 2023-05-06 DIAGNOSIS — M25651 Stiffness of right hip, not elsewhere classified: Secondary | ICD-10-CM | POA: Diagnosis not present

## 2023-05-06 DIAGNOSIS — M25652 Stiffness of left hip, not elsewhere classified: Secondary | ICD-10-CM | POA: Diagnosis not present

## 2023-05-06 DIAGNOSIS — M7062 Trochanteric bursitis, left hip: Secondary | ICD-10-CM | POA: Diagnosis not present

## 2023-05-06 DIAGNOSIS — G8929 Other chronic pain: Secondary | ICD-10-CM | POA: Diagnosis not present

## 2023-05-06 DIAGNOSIS — R29898 Other symptoms and signs involving the musculoskeletal system: Secondary | ICD-10-CM | POA: Diagnosis not present

## 2023-05-10 DIAGNOSIS — R2689 Other abnormalities of gait and mobility: Secondary | ICD-10-CM | POA: Diagnosis not present

## 2023-05-10 DIAGNOSIS — H8190 Unspecified disorder of vestibular function, unspecified ear: Secondary | ICD-10-CM | POA: Diagnosis not present

## 2023-05-10 DIAGNOSIS — G629 Polyneuropathy, unspecified: Secondary | ICD-10-CM | POA: Diagnosis not present

## 2023-05-13 DIAGNOSIS — M7061 Trochanteric bursitis, right hip: Secondary | ICD-10-CM | POA: Diagnosis not present

## 2023-05-13 DIAGNOSIS — M7062 Trochanteric bursitis, left hip: Secondary | ICD-10-CM | POA: Diagnosis not present

## 2023-05-20 DIAGNOSIS — M25512 Pain in left shoulder: Secondary | ICD-10-CM | POA: Diagnosis not present

## 2023-05-20 DIAGNOSIS — G8929 Other chronic pain: Secondary | ICD-10-CM | POA: Diagnosis not present

## 2023-05-20 DIAGNOSIS — R29898 Other symptoms and signs involving the musculoskeletal system: Secondary | ICD-10-CM | POA: Diagnosis not present

## 2023-05-20 DIAGNOSIS — M7062 Trochanteric bursitis, left hip: Secondary | ICD-10-CM | POA: Diagnosis not present

## 2023-05-20 DIAGNOSIS — M7061 Trochanteric bursitis, right hip: Secondary | ICD-10-CM | POA: Diagnosis not present

## 2023-05-20 DIAGNOSIS — M25652 Stiffness of left hip, not elsewhere classified: Secondary | ICD-10-CM | POA: Diagnosis not present

## 2023-05-20 DIAGNOSIS — M25612 Stiffness of left shoulder, not elsewhere classified: Secondary | ICD-10-CM | POA: Diagnosis not present

## 2023-05-20 DIAGNOSIS — M25651 Stiffness of right hip, not elsewhere classified: Secondary | ICD-10-CM | POA: Diagnosis not present

## 2023-05-24 ENCOUNTER — Encounter: Payer: Self-pay | Admitting: Sports Medicine

## 2023-05-24 ENCOUNTER — Ambulatory Visit: Payer: Medicare PPO | Admitting: Sports Medicine

## 2023-05-24 DIAGNOSIS — M5412 Radiculopathy, cervical region: Secondary | ICD-10-CM

## 2023-05-24 NOTE — Assessment & Plan Note (Signed)
This is a very pleasant 82 year old female returns, she has chronic axial and left-sided periscapular neck pain, known cervical DDD, she had some trigger point injections back in August that did well. Her neck pain improved with the trigger point injections but she continued to have periscapular discomfort. She had done home physical therapy along the way, as she was still having periscapular discomfort suspected to be radicular we added gabapentin, she is only taking 1 pill at night and feels no sedation. She is not feeling much better. She will increase to twice daily for a week and then 3 times daily and let me know how things are going.

## 2023-05-24 NOTE — Progress Notes (Signed)
    Procedures performed today:    None.  Independent interpretation of notes and tests performed by another provider:   None.  Brief History, Exam, Impression, and Recommendations:    Radiculitis of left cervical region This is a very pleasant 82 year old female returns, she has chronic axial and left-sided periscapular neck pain, known cervical DDD, she had some trigger point injections back in August that did well. Her neck pain improved with the trigger point injections but she continued to have periscapular discomfort. She had done home physical therapy along the way, as she was still having periscapular discomfort suspected to be radicular we added gabapentin, she is only taking 1 pill at night and feels no sedation. She is not feeling much better. She will increase to twice daily for a week and then 3 times daily and let me know how things are going.    ____________________________________________ Ihor Austin. Benjamin Stain, M.D., ABFM., CAQSM., AME. Primary Care and Sports Medicine [place] [facility]  Adjunct Professor of Family Medicine  [place] [employer] [facility] [employer]  Restaurant manager, fast food

## 2023-05-27 DIAGNOSIS — M7062 Trochanteric bursitis, left hip: Secondary | ICD-10-CM | POA: Diagnosis not present

## 2023-05-27 DIAGNOSIS — M7061 Trochanteric bursitis, right hip: Secondary | ICD-10-CM | POA: Diagnosis not present

## 2023-05-27 DIAGNOSIS — R29898 Other symptoms and signs involving the musculoskeletal system: Secondary | ICD-10-CM | POA: Diagnosis not present

## 2023-05-27 DIAGNOSIS — M25612 Stiffness of left shoulder, not elsewhere classified: Secondary | ICD-10-CM | POA: Diagnosis not present

## 2023-05-27 DIAGNOSIS — G8929 Other chronic pain: Secondary | ICD-10-CM | POA: Diagnosis not present

## 2023-05-27 DIAGNOSIS — M25512 Pain in left shoulder: Secondary | ICD-10-CM | POA: Diagnosis not present

## 2023-06-03 DIAGNOSIS — M25512 Pain in left shoulder: Secondary | ICD-10-CM | POA: Diagnosis not present

## 2023-06-03 DIAGNOSIS — M7062 Trochanteric bursitis, left hip: Secondary | ICD-10-CM | POA: Diagnosis not present

## 2023-06-03 DIAGNOSIS — M7061 Trochanteric bursitis, right hip: Secondary | ICD-10-CM | POA: Diagnosis not present

## 2023-06-03 DIAGNOSIS — R29898 Other symptoms and signs involving the musculoskeletal system: Secondary | ICD-10-CM | POA: Diagnosis not present

## 2023-06-03 DIAGNOSIS — G8929 Other chronic pain: Secondary | ICD-10-CM | POA: Diagnosis not present

## 2023-06-03 DIAGNOSIS — M25652 Stiffness of left hip, not elsewhere classified: Secondary | ICD-10-CM | POA: Diagnosis not present

## 2023-06-03 DIAGNOSIS — M25612 Stiffness of left shoulder, not elsewhere classified: Secondary | ICD-10-CM | POA: Diagnosis not present

## 2023-06-03 DIAGNOSIS — M25651 Stiffness of right hip, not elsewhere classified: Secondary | ICD-10-CM | POA: Diagnosis not present

## 2023-06-10 DIAGNOSIS — M25612 Stiffness of left shoulder, not elsewhere classified: Secondary | ICD-10-CM | POA: Diagnosis not present

## 2023-06-10 DIAGNOSIS — M7062 Trochanteric bursitis, left hip: Secondary | ICD-10-CM | POA: Diagnosis not present

## 2023-06-10 DIAGNOSIS — G8929 Other chronic pain: Secondary | ICD-10-CM | POA: Diagnosis not present

## 2023-06-10 DIAGNOSIS — M25651 Stiffness of right hip, not elsewhere classified: Secondary | ICD-10-CM | POA: Diagnosis not present

## 2023-06-10 DIAGNOSIS — M25512 Pain in left shoulder: Secondary | ICD-10-CM | POA: Diagnosis not present

## 2023-06-10 DIAGNOSIS — M25652 Stiffness of left hip, not elsewhere classified: Secondary | ICD-10-CM | POA: Diagnosis not present

## 2023-06-10 DIAGNOSIS — M7061 Trochanteric bursitis, right hip: Secondary | ICD-10-CM | POA: Diagnosis not present

## 2023-06-10 DIAGNOSIS — R29898 Other symptoms and signs involving the musculoskeletal system: Secondary | ICD-10-CM | POA: Diagnosis not present

## 2023-06-17 DIAGNOSIS — M25512 Pain in left shoulder: Secondary | ICD-10-CM | POA: Diagnosis not present

## 2023-06-17 DIAGNOSIS — G8929 Other chronic pain: Secondary | ICD-10-CM | POA: Diagnosis not present

## 2023-06-17 DIAGNOSIS — R29898 Other symptoms and signs involving the musculoskeletal system: Secondary | ICD-10-CM | POA: Diagnosis not present

## 2023-06-17 DIAGNOSIS — M25651 Stiffness of right hip, not elsewhere classified: Secondary | ICD-10-CM | POA: Diagnosis not present

## 2023-06-17 DIAGNOSIS — M7061 Trochanteric bursitis, right hip: Secondary | ICD-10-CM | POA: Diagnosis not present

## 2023-06-17 DIAGNOSIS — M7062 Trochanteric bursitis, left hip: Secondary | ICD-10-CM | POA: Diagnosis not present

## 2023-06-17 DIAGNOSIS — M25652 Stiffness of left hip, not elsewhere classified: Secondary | ICD-10-CM | POA: Diagnosis not present

## 2023-06-17 DIAGNOSIS — M25612 Stiffness of left shoulder, not elsewhere classified: Secondary | ICD-10-CM | POA: Diagnosis not present

## 2023-07-06 ENCOUNTER — Ambulatory Visit: Payer: Medicare PPO

## 2023-07-06 ENCOUNTER — Ambulatory Visit: Payer: Medicare PPO | Admitting: Sports Medicine

## 2023-07-06 ENCOUNTER — Encounter: Payer: Self-pay | Admitting: Sports Medicine

## 2023-07-06 ENCOUNTER — Other Ambulatory Visit: Payer: Self-pay

## 2023-07-06 DIAGNOSIS — M25551 Pain in right hip: Secondary | ICD-10-CM

## 2023-07-06 DIAGNOSIS — G8929 Other chronic pain: Secondary | ICD-10-CM

## 2023-07-06 DIAGNOSIS — E559 Vitamin D deficiency, unspecified: Secondary | ICD-10-CM | POA: Diagnosis not present

## 2023-07-06 DIAGNOSIS — E7841 Elevated Lipoprotein(a): Secondary | ICD-10-CM | POA: Diagnosis not present

## 2023-07-06 DIAGNOSIS — Z96643 Presence of artificial hip joint, bilateral: Secondary | ICD-10-CM

## 2023-07-06 DIAGNOSIS — M25552 Pain in left hip: Secondary | ICD-10-CM | POA: Diagnosis not present

## 2023-07-06 DIAGNOSIS — M2550 Pain in unspecified joint: Secondary | ICD-10-CM | POA: Diagnosis not present

## 2023-07-06 DIAGNOSIS — R739 Hyperglycemia, unspecified: Secondary | ICD-10-CM | POA: Diagnosis not present

## 2023-07-06 DIAGNOSIS — E538 Deficiency of other specified B group vitamins: Secondary | ICD-10-CM | POA: Diagnosis not present

## 2023-07-06 DIAGNOSIS — R202 Paresthesia of skin: Secondary | ICD-10-CM

## 2023-07-06 DIAGNOSIS — R5383 Other fatigue: Secondary | ICD-10-CM | POA: Diagnosis not present

## 2023-07-06 NOTE — Progress Notes (Addendum)
    Procedures performed today:    None.  Independent interpretation of notes and tests performed by another provider:   None.  Brief History, Exam, Impression, and Recommendations:    Chronic hip pain, bilateral Mercedes Reid returns, she is a pleasant 82 year old female, she has become quite weak, we last discussed her hip discomfort and pain in the summertime. She had some weakness to all motions of the hip and legs. She also some tenderness laterally but proximal to the greater trochanters. X-rays of the hips in 2023 were negative. She had an MRI in 2023 that did show multilevel spondylitic changes. She has now done formal physical therapy consistently since May and is not noting improvements in her strength. I am unsure as to whether this is representing an autoimmune process, structural process, or a functional process. We will get bilateral lower extremity nerve conduction and EMG, I would like a full rheumatoid workup including ESR looking for polymyalgia rheumatica, I would also like MRI of her hip. After all of the testing and MRI we will consider trochanteric bursa injections versus epidurals depending on what declares itself as the primary etiology.  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 Steward Drone.  I spent 30 minutes of total time managing this patient today, this includes chart review, face to face, and non-face to face time.  ____________________________________________ Ihor Austin. Benjamin Stain, M.D., ABFM., CAQSM., AME. Primary Care and Sports Medicine [place] [facility]  Adjunct Professor of Family Medicine  [place] [employer] [facility] [employer]  Restaurant manager, fast food

## 2023-07-06 NOTE — Assessment & Plan Note (Addendum)
Mercedes Reid returns, she is a pleasant 82 year old female, she has become quite weak, we last discussed her hip discomfort and pain in the summertime. She had some weakness to all motions of the hip and legs. She also some tenderness laterally but proximal to the greater trochanters. X-rays of the hips in 2023 were negative. She had an MRI in 2023 that did show multilevel spondylitic changes. She has now done formal physical therapy consistently since May and is not noting improvements in her strength. I am unsure as to whether this is representing an autoimmune process, structural process, or a functional process. We will get bilateral lower extremity nerve conduction and EMG, I would like a full rheumatoid workup including ESR looking for polymyalgia rheumatica, I would also like MRI of her hip. After all of the testing and MRI we will consider trochanteric bursa injections versus epidurals depending on what declares itself as the primary etiology.  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 Steward Drone.

## 2023-07-07 DIAGNOSIS — G8929 Other chronic pain: Secondary | ICD-10-CM | POA: Diagnosis not present

## 2023-07-07 DIAGNOSIS — R29898 Other symptoms and signs involving the musculoskeletal system: Secondary | ICD-10-CM | POA: Diagnosis not present

## 2023-07-07 DIAGNOSIS — M7062 Trochanteric bursitis, left hip: Secondary | ICD-10-CM | POA: Diagnosis not present

## 2023-07-07 DIAGNOSIS — M25612 Stiffness of left shoulder, not elsewhere classified: Secondary | ICD-10-CM | POA: Diagnosis not present

## 2023-07-07 DIAGNOSIS — M25652 Stiffness of left hip, not elsewhere classified: Secondary | ICD-10-CM | POA: Diagnosis not present

## 2023-07-07 DIAGNOSIS — M25651 Stiffness of right hip, not elsewhere classified: Secondary | ICD-10-CM | POA: Diagnosis not present

## 2023-07-07 DIAGNOSIS — M7061 Trochanteric bursitis, right hip: Secondary | ICD-10-CM | POA: Diagnosis not present

## 2023-07-07 DIAGNOSIS — M25512 Pain in left shoulder: Secondary | ICD-10-CM | POA: Diagnosis not present

## 2023-07-08 ENCOUNTER — Ambulatory Visit: Payer: Medicare PPO | Admitting: Neurology

## 2023-07-08 DIAGNOSIS — R202 Paresthesia of skin: Secondary | ICD-10-CM | POA: Diagnosis not present

## 2023-07-08 LAB — CBC WITH DIFFERENTIAL/PLATELET
Basophils Absolute: 0 10*3/uL (ref 0.0–0.2)
Basos: 1 %
EOS (ABSOLUTE): 0.1 x10E3/uL (ref 0.0–0.4)
Eos: 2 %
Hematocrit: 42.7 % (ref 34.0–46.6)
Hemoglobin: 13.9 g/dL (ref 11.1–15.9)
Immature Grans (Abs): 0 10*3/uL (ref 0.0–0.1)
Immature Granulocytes: 0 %
Lymphocytes Absolute: 1.6 x10E3/uL (ref 0.7–3.1)
Lymphs: 28 %
MCH: 30.8 pg (ref 26.6–33.0)
MCHC: 32.6 g/dL (ref 31.5–35.7)
MCV: 95 fL (ref 79–97)
Monocytes Absolute: 0.6 10*3/uL (ref 0.1–0.9)
Monocytes: 10 %
Neutrophils Absolute: 3.4 10*3/uL (ref 1.4–7.0)
Neutrophils: 59 %
Platelets: 258 x10E3/uL (ref 150–450)
RBC: 4.51 x10E6/uL (ref 3.77–5.28)
RDW: 11.6 % — ABNORMAL LOW (ref 11.7–15.4)
WBC: 5.7 x10E3/uL (ref 3.4–10.8)

## 2023-07-08 LAB — SYSTEMIC LUPUS PROFILE A
Chromatin Ab SerPl-aCnc: <0.2 AI (ref 0.0–0.9)
ENA RNP Ab: <0.2 AI (ref 0.0–0.9)
ENA SM Ab Ser-aCnc: <0.2 AI (ref 0.0–0.9)
ENA SSA (RO) Ab: >8 AI — ABNORMAL HIGH (ref 0.0–0.9)
ENA SSB (LA) Ab: <0.2 AI (ref 0.0–0.9)
Rheumatoid fact SerPl-aCnc: 12.2 [IU]/mL (ref <14.0)
dsDNA Ab: <1 [IU]/mL (ref 0–9)

## 2023-07-08 LAB — COMPREHENSIVE METABOLIC PANEL
ALT: 10 [IU]/L (ref 0–32)
Albumin: 4.1 g/dL (ref 3.7–4.7)
BUN: 19 mg/dL (ref 8–27)
Bilirubin Total: 1 mg/dL (ref 0.0–1.2)
CO2: 23 mmol/L (ref 20–29)
Calcium: 9.6 mg/dL (ref 8.7–10.3)
Chloride: 101 mmol/L (ref 96–106)
Globulin, Total: 2.7 g/dL (ref 1.5–4.5)
Total Protein: 6.8 g/dL (ref 6.0–8.5)
eGFR: 61 mL/min/{1.73_m2} (ref >59)

## 2023-07-08 LAB — COMPREHENSIVE METABOLIC PANEL WITH GFR
AST: 18 IU/L (ref 0–40)
Alkaline Phosphatase: 72 IU/L (ref 44–121)
BUN/Creatinine Ratio: 20 (ref 12–28)
Creatinine, Ser: 0.94 mg/dL (ref 0.57–1.00)
Glucose: 79 mg/dL (ref 70–99)
Potassium: 4.8 mmol/L (ref 3.5–5.2)
Sodium: 139 mmol/L (ref 134–144)

## 2023-07-08 LAB — SEDIMENTATION RATE: Sed Rate: 3 mm/h (ref 0–40)

## 2023-07-08 LAB — CK: Total CK: 36 U/L (ref 26–161)

## 2023-07-08 LAB — RHEUMATOID ARTHRITIS PROFILE: Cyclic Citrullin Peptide Ab: 7 U (ref 0–19)

## 2023-07-08 LAB — URIC ACID: Uric Acid: 5.5 mg/dL (ref 3.1–7.9)

## 2023-07-08 NOTE — Procedures (Signed)
[facility]  [address] [place], [address]  [place], [place] [address] Tel: [phone] Fax: [phone] Test Date:  07/08/2023  Patient: Mercedes Reid DOB: [birth year] Physician: Nita Sickle, DO  Sex: Female Height: 5\' 5"  Ref Phys: Rodney Langton, MD  ID#: [id number]   Technician:    History: This is a 82 year old female referred for evaluation of bilateral leg weakness.  NCV & EMG Findings: Electrodiagnostic testing of the right lower extremity and additional studies of the left shows: Bilateral sural and superficial peroneal sensory responses are within normal limits. Bilateral peroneal motor responses at the extensor digitorum brevis is mildly reduced, and normal at the tibialis anterior; these findings are normal for age.  Bilateral tibial motor responses are within normal limits. Bilateral tibial H reflex studies are within normal limits. There is no evidence of active or chronic motor axonal changes affecting any of the tested muscles.  Motor unit configuration and recruitment pattern is within normal limits.     Impression: This is a normal study of the lower extremities.  In particular, there is no evidence of a large fiber sensorimotor polyneuropathy, myopathy, or lumbosacral radiculopathy.   ___________________________ Nita Sickle, DO    Nerve Conduction Studies   Stim Site NR Peak (ms) Norm Peak (ms) O-P Amp (V) Norm O-P Amp  Left Sup Peroneal Anti Sensory (Ant Lat Mall)  32 C  12 cm    2.7 <4.6 6.8 >3  Right Sup Peroneal Anti Sensory (Ant Lat Mall)  32 C  12 cm    2.8 <4.6 6.5 >3  Left Sural Anti Sensory (Lat Mall)  32 C  Calf    3.3 <4.6 7.4 >3  Right Sural Anti Sensory (Lat Mall)  32 C  Calf    3.3 <4.6 7.5 >3     Stim Site NR Onset (ms) Norm Onset (ms) O-P Amp (mV) Norm O-P Amp Site1 Site2 Delta-0 (ms) Dist (cm) Vel (m/s) Norm Vel (m/s)  Left Peroneal Motor (Ext Dig Brev)  32 C  Ankle    4.8 <6.0 *1.0 >2.5 B Fib Ankle 7.5 38.0 51  >40  B Fib    12.3  0.6  Poplt B Fib 1.5 8.0 53 >40  Poplt    13.8  0.7         Right Peroneal Motor (Ext Dig Brev)  32 C  Ankle    4.4 <6.0 *1.9 >2.5 B Fib Ankle 7.2 38.0 53 >40  B Fib    11.6  1.8  Poplt B Fib 1.5 8.0 53 >40  Poplt    13.1  1.8         Left Peroneal TA Motor (Tib Ant)  32 C  Fib Head    2.3 <4.5 4.7 >3 Poplit Fib Head 1.6 8.0 50 >40  Poplit    3.9 <5.7 4.7         Right Peroneal TA Motor (Tib Ant)  32 C  Fib Head    2.5 <4.5 4.3 >3 Poplit Fib Head 1.2 7.0 58 >40  Poplit    3.7 <5.7 4.0         Left Tibial Motor (Abd Hall Brev)  32 C  Ankle    5.0 <6.0 6.9 >4 Knee Ankle 8.9 42.0 47 >40  Knee    13.9  4.7         Right Tibial Motor (Abd Hall Brev)  32 C  Ankle    4.5 <6.0 6.6 >4 Knee Ankle 10.4 42.0  40 >40  Knee    14.9  4.0          Electromyography   Side Muscle Ins.Act Fibs Fasc Recrt Amp Dur Poly Activation Comment  Right AntTibialis Nml Nml Nml Nml Nml Nml Nml Nml N/A  Right Gastroc Nml Nml Nml Nml Nml Nml Nml Nml N/A  Right RectFemoris Nml Nml Nml Nml Nml Nml Nml Nml N/A  Right BicepsFemS Nml Nml Nml Nml Nml Nml Nml Nml N/A  Right GluteusMed Nml Nml Nml Nml Nml Nml Nml Nml N/A  Left AntTibialis Nml Nml Nml Nml Nml Nml Nml Nml N/A  Left Gastroc Nml Nml Nml Nml Nml Nml Nml Nml N/A  Left RectFemoris Nml Nml Nml Nml Nml Nml Nml Nml N/A  Left GluteusMed Nml Nml Nml Nml Nml Nml Nml Nml N/A  Left BicepsFemS Nml Nml Nml Nml Nml Nml Nml Nml N/A      Waveforms:

## 2023-07-15 DIAGNOSIS — M7062 Trochanteric bursitis, left hip: Secondary | ICD-10-CM | POA: Diagnosis not present

## 2023-07-15 DIAGNOSIS — M25612 Stiffness of left shoulder, not elsewhere classified: Secondary | ICD-10-CM | POA: Diagnosis not present

## 2023-07-15 DIAGNOSIS — M7061 Trochanteric bursitis, right hip: Secondary | ICD-10-CM | POA: Diagnosis not present

## 2023-07-15 DIAGNOSIS — G8929 Other chronic pain: Secondary | ICD-10-CM | POA: Diagnosis not present

## 2023-07-15 DIAGNOSIS — M25651 Stiffness of right hip, not elsewhere classified: Secondary | ICD-10-CM | POA: Diagnosis not present

## 2023-07-15 DIAGNOSIS — R29898 Other symptoms and signs involving the musculoskeletal system: Secondary | ICD-10-CM | POA: Diagnosis not present

## 2023-07-15 DIAGNOSIS — M25512 Pain in left shoulder: Secondary | ICD-10-CM | POA: Diagnosis not present

## 2023-07-15 DIAGNOSIS — M25652 Stiffness of left hip, not elsewhere classified: Secondary | ICD-10-CM | POA: Diagnosis not present

## 2023-07-17 ENCOUNTER — Ambulatory Visit: Payer: Medicare PPO

## 2023-07-17 DIAGNOSIS — S76011A Strain of muscle, fascia and tendon of right hip, initial encounter: Secondary | ICD-10-CM | POA: Diagnosis not present

## 2023-07-17 DIAGNOSIS — M25551 Pain in right hip: Secondary | ICD-10-CM

## 2023-07-17 DIAGNOSIS — G8929 Other chronic pain: Secondary | ICD-10-CM

## 2023-07-17 DIAGNOSIS — Z96641 Presence of right artificial hip joint: Secondary | ICD-10-CM

## 2023-07-17 DIAGNOSIS — M1611 Unilateral primary osteoarthritis, right hip: Secondary | ICD-10-CM | POA: Diagnosis not present

## 2023-07-17 DIAGNOSIS — M7601 Gluteal tendinitis, right hip: Secondary | ICD-10-CM | POA: Diagnosis not present

## 2023-07-17 DIAGNOSIS — S73191A Other sprain of right hip, initial encounter: Secondary | ICD-10-CM | POA: Diagnosis not present

## 2023-07-17 DIAGNOSIS — Z96643 Presence of artificial hip joint, bilateral: Secondary | ICD-10-CM

## 2023-07-21 ENCOUNTER — Other Ambulatory Visit: Payer: Self-pay | Admitting: Sports Medicine

## 2023-07-21 DIAGNOSIS — H1013 Acute atopic conjunctivitis, bilateral: Secondary | ICD-10-CM

## 2023-07-26 ENCOUNTER — Other Ambulatory Visit: Payer: Self-pay | Admitting: Sports Medicine

## 2023-07-26 DIAGNOSIS — M170 Bilateral primary osteoarthritis of knee: Secondary | ICD-10-CM

## 2023-07-30 NOTE — Addendum Note (Signed)
Addended by: Monica Becton on: 07/30/2023 04:40 PM   Modules accepted: Orders

## 2023-08-05 ENCOUNTER — Ambulatory Visit: Payer: Medicare PPO | Admitting: Sports Medicine

## 2023-08-05 ENCOUNTER — Other Ambulatory Visit (INDEPENDENT_AMBULATORY_CARE_PROVIDER_SITE_OTHER): Payer: Medicare PPO

## 2023-08-05 DIAGNOSIS — M170 Bilateral primary osteoarthritis of knee: Secondary | ICD-10-CM | POA: Diagnosis not present

## 2023-08-05 NOTE — Progress Notes (Signed)
    Procedures performed today:    Procedure: Real-time Ultrasound Guided injection of the left knee Device: Samsung HS60  Verbal informed consent obtained.  Time-out conducted.  Noted no overlying erythema, induration, or other signs of local infection.  Skin prepped in a sterile fashion.  Local anesthesia: Topical Ethyl chloride.  With sterile technique and under real time ultrasound guidance: 1 cc Kenalog  40, 2 cc lidocaine, 2 cc bupivacaine injected easily Completed without difficulty  Advised to call if fevers/chills, erythema, induration, drainage, or persistent bleeding.  Images permanently stored and available for review in PACS.  Impression: Technically successful ultrasound guided injection.  Independent interpretation of notes and tests performed by another provider:   None.  Brief History, Exam, Impression, and Recommendations:    Primary osteoarthritis of both knees This is a very pleasant 83 year old female, she has known left knee osteoarthritis, her last injection was in January 2023, 2 years ago. We repeated her knee injection today, I would like her to do some physical therapy for her knees as well as some prehab for her hips.    ____________________________________________ [name]. Curtis, M.D., ABFM., CAQSM., AME. Primary Care and Sports Medicine [facility]  Adjunct Professor of [facility] Medicine  [employer]  Restaurant Manager, [employer]

## 2023-08-05 NOTE — Assessment & Plan Note (Signed)
 This is a very pleasant 83 year old female, she has known left knee osteoarthritis, her last injection was in January 2023, 2 years ago. We repeated her knee injection today, I would like her to do some physical therapy for her knees as well as some prehab for her hips.

## 2023-08-06 DIAGNOSIS — M170 Bilateral primary osteoarthritis of knee: Secondary | ICD-10-CM | POA: Diagnosis not present

## 2023-08-06 MED ORDER — TRIAMCINOLONE ACETONIDE 40 MG/ML IJ SUSP
40.0000 mg | Freq: Once | INTRAMUSCULAR | Status: AC
  Administered 2023-08-06: 40 mg via INTRAMUSCULAR

## 2023-08-06 NOTE — Addendum Note (Signed)
 Addended by: Carren Rang A on: 08/06/2023 01:15 PM   Modules accepted: Orders

## 2023-08-06 NOTE — Code Documentation (Signed)
 done

## 2023-08-12 DIAGNOSIS — R29898 Other symptoms and signs involving the musculoskeletal system: Secondary | ICD-10-CM | POA: Diagnosis not present

## 2023-08-12 DIAGNOSIS — M170 Bilateral primary osteoarthritis of knee: Secondary | ICD-10-CM | POA: Diagnosis not present

## 2023-08-12 DIAGNOSIS — M25652 Stiffness of left hip, not elsewhere classified: Secondary | ICD-10-CM | POA: Diagnosis not present

## 2023-08-12 DIAGNOSIS — M25651 Stiffness of right hip, not elsewhere classified: Secondary | ICD-10-CM | POA: Diagnosis not present

## 2023-08-18 ENCOUNTER — Ambulatory Visit (HOSPITAL_BASED_OUTPATIENT_CLINIC_OR_DEPARTMENT_OTHER): Payer: Medicare PPO | Admitting: Orthopaedic Surgery

## 2023-08-18 DIAGNOSIS — S76011A Strain of muscle, fascia and tendon of right hip, initial encounter: Secondary | ICD-10-CM

## 2023-08-18 DIAGNOSIS — S76012A Strain of muscle, fascia and tendon of left hip, initial encounter: Secondary | ICD-10-CM

## 2023-08-18 NOTE — Progress Notes (Signed)
 Chief Complaint: Bilateral hip pain     History of Present Illness:    Mercedes Reid is a 83 y.o. female presents today with ongoing multiple years of bilateral hip pain in the setting of weakness.  She is specifically having a very difficult time laying on this left side.  She has been seen by Dr. Elva Hamburger who has been treating for this as well as her left knee.  With regard to the left knee he did recently provide an intra-articular injection and since that time her left hip some actually been feeling much better.  At baseline she is active and does ambulate with a walker.  She experiences significant difficulty going on uneven surfaces and is no longer able to go up and down stairs without pain.  She feels weakness in both hips although she has been working with physical therapy which is helping somewhat    PMH/PSH/Family History/Social History/Meds/Allergies:    Past Medical History:  Diagnosis Date   COVID-19 2021   H/O CHF    Hypertension    Mitral valve prolapse    Paroxysmal atrial fibrillation (HCC)    Renal disorder    Severe mitral regurgitation    Thyroid  disease    HYPOTHYROIDISM   Past Surgical History:  Procedure Laterality Date   ABDOMINAL HYSTERECTOMY     COX-MAZE MICROWAVE ABLATION  02/28/2009   Dr. Lander Pines left side lesion set.   MITRAL VALVE REPAIR  03/01/2011   Quadrangular resection of posterior leaflet with leafet plication and 30-mm SorinMEMO 3D ring annuloplasty)                Social History   Socioeconomic History   Marital status: Widowed    Spouse name: Not on file   Number of children: Not on file   Years of education: Not on file   Highest education level: Not on file  Occupational History   Not on file  Tobacco Use   Smoking status: Never   Smokeless tobacco: Never  Substance and Sexual Activity   Alcohol use: No   Drug use: No   Sexual activity: Not on file  Other Topics Concern   Not on file  Social History Narrative   Not on  file   Social Drivers of Health   Financial Resource Strain: Not on file  Food Insecurity: Not on file  Transportation Needs: Not on file  Physical Activity: Not on file  Stress: Not on file  Social Connections: Unknown (12/14/2021)   Received from [facility]   Social Network    Social Network: Not on file   Family History  Problem Relation Age of Onset   Heart failure Father    Allergies  Allergen Reactions   Latex    Other Nausea Only    nausea nausea    Sulfa Antibiotics    Amoxicillin-Pot Clavulanate Nausea Only   Current Outpatient Medications  Medication Sig Dispense Refill   azelastine  (OPTIVAR ) 0.05 % ophthalmic solution Place 2 drops into both eyes 2 (two) times daily. 6 mL 11   Calcium Carbonate-Vitamin D 600-200 MG-UNIT TABS Take by mouth.     Cholecalciferol (VITAMIN D3) 1.25 MG (50000 UT) TABS Take 1.25 mg by mouth daily at 2 PM. OTC- pt is taking 2 daily     diclofenac  Sodium (VOLTAREN ) 1 % GEL Apply 4 g topically 4 (four) times daily. To both knees 100 g 11   estradiol (ESTRACE) 1 MG tablet Take 1  mg by mouth daily.       fexofenadine  (ALLEGRA  ALLERGY) 180 MG tablet Take 1 tablet (180 mg total) by mouth daily. 30 tablet 0   gabapentin  (NEURONTIN ) 300 MG capsule One tab PO qHS for a week, then 2 tabs nightly for a week then 3 tabs nightly if needed 90 capsule 3   levothyroxine (SYNTHROID) 100 MCG tablet Take 100 mcg by mouth daily before breakfast.     losartan (COZAAR) 100 MG tablet Take 100 mg by mouth daily.     metoprolol succinate (TOPROL-XL) 25 MG 24 hr tablet Take 12.5 mg by mouth daily.     traMADol  (ULTRAM ) 50 MG tablet TAKE 1-2 TABLETS (50-100 MG TOTAL) BY MOUTH 3 (THREE) TIMES DAILY AS NEEDED. 180 tablet 1   triamterene-hydrochlorothiazide (DYAZIDE) 37.5-25 MG capsule Take 1 capsule by mouth daily.     No current facility-administered medications for this visit.   No results found.  Review of Systems:   A ROS was performed  including pertinent positives and negatives as documented in the HPI.  Physical Exam :   Constitutional: NAD and appears stated age Neurological: Alert and oriented Psych: Appropriate affect and cooperative There were no vitals taken for this visit.   Comprehensive Musculoskeletal Exam:    Positive tenderness about bilateral greater trochanteric worse on the left than the right.  There is mild Trendelenburg bilaterally although this is mitigated by her walker.  Range of motion is 30 degrees internal/external rotation of bilateral hips without significant pain.  There is weakness with resisted abduction.  Distal neurosensory exam is intact   Imaging:   Xray (AP pelvis, bilateral hip 2 views): minimal femoral acetabular osteoarthritis  MRI (right hip): Right worse than left gluteus medius tearing full-thickness on the right   I personally reviewed and interpreted the radiographs.   Assessment and Plan:   83 y.o. female with evidence of bilateral gluteus medius tearing.  I did describe that on the right this is full-thickness which does make sense as the left side is partial-thickness which typically is more symptomatic.  That being said she does have fairly significant weakness and changes in balance which I do believe are attributed to her tears.  I did describe that her hips are somewhat complex that she does not have significant pain at today's visit and as result any type of surgical her repair would be predominantly aimed at regaining her balance.  At this time given the fact that she does not have pain she would like to defer on any type of gluteus medius repair although I do ultimately believe she would be a great candidate for this.  She will continue to work on strengthening of her hips with physical therapy which is a good idea.  I will plan to see her back as needed   I personally saw and evaluated the patient, and participated in the management and treatment plan.  Wilhelmenia Harada, MD Attending Physician, Orthopedic Surgery  This document was dictated using Dragon voice recognition software. A reasonable attempt at proof reading has been made to minimize errors.

## 2023-08-20 DIAGNOSIS — M25651 Stiffness of right hip, not elsewhere classified: Secondary | ICD-10-CM | POA: Diagnosis not present

## 2023-08-20 DIAGNOSIS — R29898 Other symptoms and signs involving the musculoskeletal system: Secondary | ICD-10-CM | POA: Diagnosis not present

## 2023-08-20 DIAGNOSIS — M25652 Stiffness of left hip, not elsewhere classified: Secondary | ICD-10-CM | POA: Diagnosis not present

## 2023-08-20 DIAGNOSIS — M170 Bilateral primary osteoarthritis of knee: Secondary | ICD-10-CM | POA: Diagnosis not present

## 2023-08-26 DIAGNOSIS — M25652 Stiffness of left hip, not elsewhere classified: Secondary | ICD-10-CM | POA: Diagnosis not present

## 2023-08-26 DIAGNOSIS — M170 Bilateral primary osteoarthritis of knee: Secondary | ICD-10-CM | POA: Diagnosis not present

## 2023-08-26 DIAGNOSIS — R29898 Other symptoms and signs involving the musculoskeletal system: Secondary | ICD-10-CM | POA: Diagnosis not present

## 2023-08-26 DIAGNOSIS — M25651 Stiffness of right hip, not elsewhere classified: Secondary | ICD-10-CM | POA: Diagnosis not present

## 2023-09-02 DIAGNOSIS — M25651 Stiffness of right hip, not elsewhere classified: Secondary | ICD-10-CM | POA: Diagnosis not present

## 2023-09-02 DIAGNOSIS — R29898 Other symptoms and signs involving the musculoskeletal system: Secondary | ICD-10-CM | POA: Diagnosis not present

## 2023-09-02 DIAGNOSIS — M25652 Stiffness of left hip, not elsewhere classified: Secondary | ICD-10-CM | POA: Diagnosis not present

## 2023-09-02 DIAGNOSIS — M170 Bilateral primary osteoarthritis of knee: Secondary | ICD-10-CM | POA: Diagnosis not present

## 2023-09-07 DIAGNOSIS — M170 Bilateral primary osteoarthritis of knee: Secondary | ICD-10-CM | POA: Diagnosis not present

## 2023-09-08 DIAGNOSIS — H25811 Combined forms of age-related cataract, right eye: Secondary | ICD-10-CM | POA: Diagnosis not present

## 2023-09-09 DIAGNOSIS — R29898 Other symptoms and signs involving the musculoskeletal system: Secondary | ICD-10-CM | POA: Diagnosis not present

## 2023-09-09 DIAGNOSIS — M170 Bilateral primary osteoarthritis of knee: Secondary | ICD-10-CM | POA: Diagnosis not present

## 2023-09-09 DIAGNOSIS — M25652 Stiffness of left hip, not elsewhere classified: Secondary | ICD-10-CM | POA: Diagnosis not present

## 2023-09-09 DIAGNOSIS — M25651 Stiffness of right hip, not elsewhere classified: Secondary | ICD-10-CM | POA: Diagnosis not present

## 2023-09-14 DIAGNOSIS — R29898 Other symptoms and signs involving the musculoskeletal system: Secondary | ICD-10-CM | POA: Diagnosis not present

## 2023-09-14 DIAGNOSIS — M25652 Stiffness of left hip, not elsewhere classified: Secondary | ICD-10-CM | POA: Diagnosis not present

## 2023-09-14 DIAGNOSIS — M170 Bilateral primary osteoarthritis of knee: Secondary | ICD-10-CM | POA: Diagnosis not present

## 2023-09-14 DIAGNOSIS — M25651 Stiffness of right hip, not elsewhere classified: Secondary | ICD-10-CM | POA: Diagnosis not present

## 2023-09-16 DIAGNOSIS — M170 Bilateral primary osteoarthritis of knee: Secondary | ICD-10-CM | POA: Diagnosis not present

## 2023-09-16 DIAGNOSIS — M25652 Stiffness of left hip, not elsewhere classified: Secondary | ICD-10-CM | POA: Diagnosis not present

## 2023-09-16 DIAGNOSIS — M25651 Stiffness of right hip, not elsewhere classified: Secondary | ICD-10-CM | POA: Diagnosis not present

## 2023-09-16 DIAGNOSIS — R29898 Other symptoms and signs involving the musculoskeletal system: Secondary | ICD-10-CM | POA: Diagnosis not present

## 2023-09-21 DIAGNOSIS — R29898 Other symptoms and signs involving the musculoskeletal system: Secondary | ICD-10-CM | POA: Diagnosis not present

## 2023-09-21 DIAGNOSIS — M25651 Stiffness of right hip, not elsewhere classified: Secondary | ICD-10-CM | POA: Diagnosis not present

## 2023-09-21 DIAGNOSIS — M170 Bilateral primary osteoarthritis of knee: Secondary | ICD-10-CM | POA: Diagnosis not present

## 2023-09-21 DIAGNOSIS — M25652 Stiffness of left hip, not elsewhere classified: Secondary | ICD-10-CM | POA: Diagnosis not present

## 2023-09-28 DIAGNOSIS — M25652 Stiffness of left hip, not elsewhere classified: Secondary | ICD-10-CM | POA: Diagnosis not present

## 2023-09-28 DIAGNOSIS — M170 Bilateral primary osteoarthritis of knee: Secondary | ICD-10-CM | POA: Diagnosis not present

## 2023-09-28 DIAGNOSIS — R29898 Other symptoms and signs involving the musculoskeletal system: Secondary | ICD-10-CM | POA: Diagnosis not present

## 2023-09-28 DIAGNOSIS — M25651 Stiffness of right hip, not elsewhere classified: Secondary | ICD-10-CM | POA: Diagnosis not present

## 2023-10-01 DIAGNOSIS — M25651 Stiffness of right hip, not elsewhere classified: Secondary | ICD-10-CM | POA: Diagnosis not present

## 2023-10-01 DIAGNOSIS — M25652 Stiffness of left hip, not elsewhere classified: Secondary | ICD-10-CM | POA: Diagnosis not present

## 2023-10-01 DIAGNOSIS — R29898 Other symptoms and signs involving the musculoskeletal system: Secondary | ICD-10-CM | POA: Diagnosis not present

## 2023-10-01 DIAGNOSIS — M170 Bilateral primary osteoarthritis of knee: Secondary | ICD-10-CM | POA: Diagnosis not present

## 2023-10-05 DIAGNOSIS — M25651 Stiffness of right hip, not elsewhere classified: Secondary | ICD-10-CM | POA: Diagnosis not present

## 2023-10-05 DIAGNOSIS — R29898 Other symptoms and signs involving the musculoskeletal system: Secondary | ICD-10-CM | POA: Diagnosis not present

## 2023-10-05 DIAGNOSIS — M170 Bilateral primary osteoarthritis of knee: Secondary | ICD-10-CM | POA: Diagnosis not present

## 2023-10-05 DIAGNOSIS — M25652 Stiffness of left hip, not elsewhere classified: Secondary | ICD-10-CM | POA: Diagnosis not present

## 2023-10-07 DIAGNOSIS — M25652 Stiffness of left hip, not elsewhere classified: Secondary | ICD-10-CM | POA: Diagnosis not present

## 2023-10-07 DIAGNOSIS — R29898 Other symptoms and signs involving the musculoskeletal system: Secondary | ICD-10-CM | POA: Diagnosis not present

## 2023-10-07 DIAGNOSIS — M170 Bilateral primary osteoarthritis of knee: Secondary | ICD-10-CM | POA: Diagnosis not present

## 2023-10-07 DIAGNOSIS — M25651 Stiffness of right hip, not elsewhere classified: Secondary | ICD-10-CM | POA: Diagnosis not present

## 2023-10-12 DIAGNOSIS — M25652 Stiffness of left hip, not elsewhere classified: Secondary | ICD-10-CM | POA: Diagnosis not present

## 2023-10-12 DIAGNOSIS — R29898 Other symptoms and signs involving the musculoskeletal system: Secondary | ICD-10-CM | POA: Diagnosis not present

## 2023-10-12 DIAGNOSIS — M25651 Stiffness of right hip, not elsewhere classified: Secondary | ICD-10-CM | POA: Diagnosis not present

## 2023-10-12 DIAGNOSIS — M170 Bilateral primary osteoarthritis of knee: Secondary | ICD-10-CM | POA: Diagnosis not present

## 2023-10-15 DIAGNOSIS — M170 Bilateral primary osteoarthritis of knee: Secondary | ICD-10-CM | POA: Diagnosis not present

## 2023-10-15 DIAGNOSIS — R29898 Other symptoms and signs involving the musculoskeletal system: Secondary | ICD-10-CM | POA: Diagnosis not present

## 2023-10-15 DIAGNOSIS — M25652 Stiffness of left hip, not elsewhere classified: Secondary | ICD-10-CM | POA: Diagnosis not present

## 2023-10-15 DIAGNOSIS — M25651 Stiffness of right hip, not elsewhere classified: Secondary | ICD-10-CM | POA: Diagnosis not present

## 2023-10-18 DIAGNOSIS — E538 Deficiency of other specified B group vitamins: Secondary | ICD-10-CM | POA: Diagnosis not present

## 2023-10-18 DIAGNOSIS — R5383 Other fatigue: Secondary | ICD-10-CM | POA: Diagnosis not present

## 2023-10-18 DIAGNOSIS — E7841 Elevated Lipoprotein(a): Secondary | ICD-10-CM | POA: Diagnosis not present

## 2023-10-18 DIAGNOSIS — R739 Hyperglycemia, unspecified: Secondary | ICD-10-CM | POA: Diagnosis not present

## 2023-10-21 DIAGNOSIS — R29898 Other symptoms and signs involving the musculoskeletal system: Secondary | ICD-10-CM | POA: Diagnosis not present

## 2023-10-21 DIAGNOSIS — I10 Essential (primary) hypertension: Secondary | ICD-10-CM | POA: Diagnosis not present

## 2023-10-21 DIAGNOSIS — M170 Bilateral primary osteoarthritis of knee: Secondary | ICD-10-CM | POA: Diagnosis not present

## 2023-10-21 DIAGNOSIS — M25652 Stiffness of left hip, not elsewhere classified: Secondary | ICD-10-CM | POA: Diagnosis not present

## 2023-10-21 DIAGNOSIS — M25651 Stiffness of right hip, not elsewhere classified: Secondary | ICD-10-CM | POA: Diagnosis not present

## 2023-10-21 DIAGNOSIS — R739 Hyperglycemia, unspecified: Secondary | ICD-10-CM | POA: Diagnosis not present

## 2023-10-21 DIAGNOSIS — N2889 Other specified disorders of kidney and ureter: Secondary | ICD-10-CM | POA: Diagnosis not present

## 2023-10-26 DIAGNOSIS — M170 Bilateral primary osteoarthritis of knee: Secondary | ICD-10-CM | POA: Diagnosis not present

## 2023-10-26 DIAGNOSIS — M25651 Stiffness of right hip, not elsewhere classified: Secondary | ICD-10-CM | POA: Diagnosis not present

## 2023-10-26 DIAGNOSIS — R29898 Other symptoms and signs involving the musculoskeletal system: Secondary | ICD-10-CM | POA: Diagnosis not present

## 2023-10-26 DIAGNOSIS — M25652 Stiffness of left hip, not elsewhere classified: Secondary | ICD-10-CM | POA: Diagnosis not present

## 2023-10-28 DIAGNOSIS — R29898 Other symptoms and signs involving the musculoskeletal system: Secondary | ICD-10-CM | POA: Diagnosis not present

## 2023-10-28 DIAGNOSIS — M170 Bilateral primary osteoarthritis of knee: Secondary | ICD-10-CM | POA: Diagnosis not present

## 2023-10-28 DIAGNOSIS — M25651 Stiffness of right hip, not elsewhere classified: Secondary | ICD-10-CM | POA: Diagnosis not present

## 2023-10-28 DIAGNOSIS — M25652 Stiffness of left hip, not elsewhere classified: Secondary | ICD-10-CM | POA: Diagnosis not present

## 2023-11-01 DIAGNOSIS — M170 Bilateral primary osteoarthritis of knee: Secondary | ICD-10-CM | POA: Diagnosis not present

## 2023-11-01 DIAGNOSIS — R29898 Other symptoms and signs involving the musculoskeletal system: Secondary | ICD-10-CM | POA: Diagnosis not present

## 2023-11-01 DIAGNOSIS — M25651 Stiffness of right hip, not elsewhere classified: Secondary | ICD-10-CM | POA: Diagnosis not present

## 2023-11-01 DIAGNOSIS — M25652 Stiffness of left hip, not elsewhere classified: Secondary | ICD-10-CM | POA: Diagnosis not present

## 2023-11-03 DIAGNOSIS — R29898 Other symptoms and signs involving the musculoskeletal system: Secondary | ICD-10-CM | POA: Diagnosis not present

## 2023-11-03 DIAGNOSIS — M25651 Stiffness of right hip, not elsewhere classified: Secondary | ICD-10-CM | POA: Diagnosis not present

## 2023-11-03 DIAGNOSIS — M170 Bilateral primary osteoarthritis of knee: Secondary | ICD-10-CM | POA: Diagnosis not present

## 2023-11-03 DIAGNOSIS — M25652 Stiffness of left hip, not elsewhere classified: Secondary | ICD-10-CM | POA: Diagnosis not present

## 2023-11-11 DIAGNOSIS — R29898 Other symptoms and signs involving the musculoskeletal system: Secondary | ICD-10-CM | POA: Diagnosis not present

## 2023-11-11 DIAGNOSIS — M170 Bilateral primary osteoarthritis of knee: Secondary | ICD-10-CM | POA: Diagnosis not present

## 2023-11-11 DIAGNOSIS — M25652 Stiffness of left hip, not elsewhere classified: Secondary | ICD-10-CM | POA: Diagnosis not present

## 2023-11-11 DIAGNOSIS — M25651 Stiffness of right hip, not elsewhere classified: Secondary | ICD-10-CM | POA: Diagnosis not present

## 2023-11-17 DIAGNOSIS — M170 Bilateral primary osteoarthritis of knee: Secondary | ICD-10-CM | POA: Diagnosis not present

## 2023-11-17 DIAGNOSIS — M25652 Stiffness of left hip, not elsewhere classified: Secondary | ICD-10-CM | POA: Diagnosis not present

## 2023-11-17 DIAGNOSIS — M25651 Stiffness of right hip, not elsewhere classified: Secondary | ICD-10-CM | POA: Diagnosis not present

## 2023-11-17 DIAGNOSIS — R29898 Other symptoms and signs involving the musculoskeletal system: Secondary | ICD-10-CM | POA: Diagnosis not present

## 2023-11-18 ENCOUNTER — Ambulatory Visit: Admitting: Sports Medicine

## 2023-11-18 ENCOUNTER — Encounter: Payer: Self-pay | Admitting: Sports Medicine

## 2023-11-18 ENCOUNTER — Other Ambulatory Visit (INDEPENDENT_AMBULATORY_CARE_PROVIDER_SITE_OTHER)

## 2023-11-18 DIAGNOSIS — M170 Bilateral primary osteoarthritis of knee: Secondary | ICD-10-CM | POA: Diagnosis not present

## 2023-11-18 MED ORDER — TRIAMCINOLONE ACETONIDE 40 MG/ML IJ SUSP
40.0000 mg | Freq: Once | INTRAMUSCULAR | Status: AC
  Administered 2023-11-18: 40 mg via INTRAMUSCULAR

## 2023-11-18 NOTE — Progress Notes (Signed)
    Procedures performed today:    Procedure: Real-time Ultrasound Guided injection of the left knee Device: Samsung HS60  Verbal informed consent obtained.  Time-out conducted.  Noted no overlying erythema, induration, or other signs of local infection.  Skin prepped in a sterile fashion.  Local anesthesia: Topical Ethyl chloride.  With sterile technique and under real time ultrasound guidance: No effusion noted 1 cc Kenalog 40, 2 cc lidocaine, 2 cc bupivacaine injected easily Completed without difficulty  Advised to call if fevers/chills, erythema, induration, drainage, or persistent bleeding.  Images permanently stored and available for review in PACS.  Impression: Technically successful ultrasound guided injection.  Independent interpretation of notes and tests performed by another provider:   None.  Brief History, Exam, Impression, and Recommendations:    Primary osteoarthritis of both knees This very pleasant 83 year old female returns, last injection in early January, repeated today, return to see me as needed, we can certainly switch to viscosupplementation if insufficient relief.    ____________________________________________ Joselyn Nicely. Sandy Crumb, M.D., ABFM., CAQSM., AME. Primary Care and Sports Medicine [facility]  Adjunct Professor of [facility] Medicine  [employer]  Restaurant manager, fast food

## 2023-11-18 NOTE — Assessment & Plan Note (Signed)
 This very pleasant 83 year old female returns, last injection in early January, repeated today, return to see me as needed, we can certainly switch to viscosupplementation if insufficient relief.

## 2023-11-18 NOTE — Addendum Note (Signed)
 Addended by: OLIVA-AVELLANEDA, Zephan Beauchaine L on: 11/18/2023 09:52 AM   Modules accepted: Orders

## 2023-11-23 DIAGNOSIS — R739 Hyperglycemia, unspecified: Secondary | ICD-10-CM | POA: Diagnosis not present

## 2023-11-23 DIAGNOSIS — M25652 Stiffness of left hip, not elsewhere classified: Secondary | ICD-10-CM | POA: Diagnosis not present

## 2023-11-23 DIAGNOSIS — R29898 Other symptoms and signs involving the musculoskeletal system: Secondary | ICD-10-CM | POA: Diagnosis not present

## 2023-11-23 DIAGNOSIS — E039 Hypothyroidism, unspecified: Secondary | ICD-10-CM | POA: Diagnosis not present

## 2023-11-23 DIAGNOSIS — E7841 Elevated Lipoprotein(a): Secondary | ICD-10-CM | POA: Diagnosis not present

## 2023-11-23 DIAGNOSIS — R5383 Other fatigue: Secondary | ICD-10-CM | POA: Diagnosis not present

## 2023-11-23 DIAGNOSIS — M25651 Stiffness of right hip, not elsewhere classified: Secondary | ICD-10-CM | POA: Diagnosis not present

## 2023-11-23 DIAGNOSIS — M170 Bilateral primary osteoarthritis of knee: Secondary | ICD-10-CM | POA: Diagnosis not present

## 2023-11-25 DIAGNOSIS — E785 Hyperlipidemia, unspecified: Secondary | ICD-10-CM | POA: Diagnosis not present

## 2023-11-25 DIAGNOSIS — I10 Essential (primary) hypertension: Secondary | ICD-10-CM | POA: Diagnosis not present

## 2023-11-25 DIAGNOSIS — N2889 Other specified disorders of kidney and ureter: Secondary | ICD-10-CM | POA: Diagnosis not present

## 2023-11-25 DIAGNOSIS — E039 Hypothyroidism, unspecified: Secondary | ICD-10-CM | POA: Diagnosis not present

## 2023-12-01 DIAGNOSIS — M170 Bilateral primary osteoarthritis of knee: Secondary | ICD-10-CM | POA: Diagnosis not present

## 2023-12-01 DIAGNOSIS — R29898 Other symptoms and signs involving the musculoskeletal system: Secondary | ICD-10-CM | POA: Diagnosis not present

## 2023-12-01 DIAGNOSIS — M25652 Stiffness of left hip, not elsewhere classified: Secondary | ICD-10-CM | POA: Diagnosis not present

## 2023-12-01 DIAGNOSIS — M25651 Stiffness of right hip, not elsewhere classified: Secondary | ICD-10-CM | POA: Diagnosis not present

## 2023-12-08 DIAGNOSIS — R29898 Other symptoms and signs involving the musculoskeletal system: Secondary | ICD-10-CM | POA: Diagnosis not present

## 2023-12-08 DIAGNOSIS — M170 Bilateral primary osteoarthritis of knee: Secondary | ICD-10-CM | POA: Diagnosis not present

## 2023-12-08 DIAGNOSIS — M25651 Stiffness of right hip, not elsewhere classified: Secondary | ICD-10-CM | POA: Diagnosis not present

## 2023-12-08 DIAGNOSIS — M25652 Stiffness of left hip, not elsewhere classified: Secondary | ICD-10-CM | POA: Diagnosis not present

## 2023-12-15 DIAGNOSIS — M170 Bilateral primary osteoarthritis of knee: Secondary | ICD-10-CM | POA: Diagnosis not present

## 2023-12-15 DIAGNOSIS — M25652 Stiffness of left hip, not elsewhere classified: Secondary | ICD-10-CM | POA: Diagnosis not present

## 2023-12-15 DIAGNOSIS — M25651 Stiffness of right hip, not elsewhere classified: Secondary | ICD-10-CM | POA: Diagnosis not present

## 2023-12-15 DIAGNOSIS — R29898 Other symptoms and signs involving the musculoskeletal system: Secondary | ICD-10-CM | POA: Diagnosis not present

## 2023-12-22 DIAGNOSIS — M25651 Stiffness of right hip, not elsewhere classified: Secondary | ICD-10-CM | POA: Diagnosis not present

## 2023-12-22 DIAGNOSIS — R29898 Other symptoms and signs involving the musculoskeletal system: Secondary | ICD-10-CM | POA: Diagnosis not present

## 2023-12-22 DIAGNOSIS — M25652 Stiffness of left hip, not elsewhere classified: Secondary | ICD-10-CM | POA: Diagnosis not present

## 2023-12-22 DIAGNOSIS — M170 Bilateral primary osteoarthritis of knee: Secondary | ICD-10-CM | POA: Diagnosis not present

## 2023-12-30 DIAGNOSIS — R29898 Other symptoms and signs involving the musculoskeletal system: Secondary | ICD-10-CM | POA: Diagnosis not present

## 2023-12-30 DIAGNOSIS — M25651 Stiffness of right hip, not elsewhere classified: Secondary | ICD-10-CM | POA: Diagnosis not present

## 2023-12-30 DIAGNOSIS — M25652 Stiffness of left hip, not elsewhere classified: Secondary | ICD-10-CM | POA: Diagnosis not present

## 2023-12-30 DIAGNOSIS — M170 Bilateral primary osteoarthritis of knee: Secondary | ICD-10-CM | POA: Diagnosis not present

## 2024-01-03 DIAGNOSIS — M170 Bilateral primary osteoarthritis of knee: Secondary | ICD-10-CM | POA: Diagnosis not present

## 2024-01-03 DIAGNOSIS — R29898 Other symptoms and signs involving the musculoskeletal system: Secondary | ICD-10-CM | POA: Diagnosis not present

## 2024-01-03 DIAGNOSIS — M25651 Stiffness of right hip, not elsewhere classified: Secondary | ICD-10-CM | POA: Diagnosis not present

## 2024-01-03 DIAGNOSIS — M25652 Stiffness of left hip, not elsewhere classified: Secondary | ICD-10-CM | POA: Diagnosis not present

## 2024-01-05 DIAGNOSIS — M25652 Stiffness of left hip, not elsewhere classified: Secondary | ICD-10-CM | POA: Diagnosis not present

## 2024-01-05 DIAGNOSIS — M25651 Stiffness of right hip, not elsewhere classified: Secondary | ICD-10-CM | POA: Diagnosis not present

## 2024-01-05 DIAGNOSIS — R29898 Other symptoms and signs involving the musculoskeletal system: Secondary | ICD-10-CM | POA: Diagnosis not present

## 2024-01-05 DIAGNOSIS — M170 Bilateral primary osteoarthritis of knee: Secondary | ICD-10-CM | POA: Diagnosis not present

## 2024-01-25 DIAGNOSIS — M25652 Stiffness of left hip, not elsewhere classified: Secondary | ICD-10-CM | POA: Diagnosis not present

## 2024-01-25 DIAGNOSIS — M25651 Stiffness of right hip, not elsewhere classified: Secondary | ICD-10-CM | POA: Diagnosis not present

## 2024-01-25 DIAGNOSIS — M170 Bilateral primary osteoarthritis of knee: Secondary | ICD-10-CM | POA: Diagnosis not present

## 2024-01-25 DIAGNOSIS — R29898 Other symptoms and signs involving the musculoskeletal system: Secondary | ICD-10-CM | POA: Diagnosis not present

## 2024-02-02 DIAGNOSIS — M25652 Stiffness of left hip, not elsewhere classified: Secondary | ICD-10-CM | POA: Diagnosis not present

## 2024-02-02 DIAGNOSIS — R29898 Other symptoms and signs involving the musculoskeletal system: Secondary | ICD-10-CM | POA: Diagnosis not present

## 2024-02-02 DIAGNOSIS — M25651 Stiffness of right hip, not elsewhere classified: Secondary | ICD-10-CM | POA: Diagnosis not present

## 2024-02-02 DIAGNOSIS — M170 Bilateral primary osteoarthritis of knee: Secondary | ICD-10-CM | POA: Diagnosis not present

## 2024-02-09 DIAGNOSIS — M25651 Stiffness of right hip, not elsewhere classified: Secondary | ICD-10-CM | POA: Diagnosis not present

## 2024-02-09 DIAGNOSIS — R29898 Other symptoms and signs involving the musculoskeletal system: Secondary | ICD-10-CM | POA: Diagnosis not present

## 2024-02-09 DIAGNOSIS — M25652 Stiffness of left hip, not elsewhere classified: Secondary | ICD-10-CM | POA: Diagnosis not present

## 2024-02-09 DIAGNOSIS — M170 Bilateral primary osteoarthritis of knee: Secondary | ICD-10-CM | POA: Diagnosis not present

## 2024-02-16 DIAGNOSIS — R29898 Other symptoms and signs involving the musculoskeletal system: Secondary | ICD-10-CM | POA: Diagnosis not present

## 2024-02-16 DIAGNOSIS — M25651 Stiffness of right hip, not elsewhere classified: Secondary | ICD-10-CM | POA: Diagnosis not present

## 2024-02-16 DIAGNOSIS — M25652 Stiffness of left hip, not elsewhere classified: Secondary | ICD-10-CM | POA: Diagnosis not present

## 2024-02-16 DIAGNOSIS — M170 Bilateral primary osteoarthritis of knee: Secondary | ICD-10-CM | POA: Diagnosis not present

## 2024-02-23 DIAGNOSIS — R29898 Other symptoms and signs involving the musculoskeletal system: Secondary | ICD-10-CM | POA: Diagnosis not present

## 2024-02-23 DIAGNOSIS — M25651 Stiffness of right hip, not elsewhere classified: Secondary | ICD-10-CM | POA: Diagnosis not present

## 2024-02-23 DIAGNOSIS — M25652 Stiffness of left hip, not elsewhere classified: Secondary | ICD-10-CM | POA: Diagnosis not present

## 2024-02-23 DIAGNOSIS — M170 Bilateral primary osteoarthritis of knee: Secondary | ICD-10-CM | POA: Diagnosis not present

## 2024-02-24 DIAGNOSIS — E7841 Elevated Lipoprotein(a): Secondary | ICD-10-CM | POA: Diagnosis not present

## 2024-02-24 DIAGNOSIS — R739 Hyperglycemia, unspecified: Secondary | ICD-10-CM | POA: Diagnosis not present

## 2024-02-24 DIAGNOSIS — R140 Abdominal distension (gaseous): Secondary | ICD-10-CM | POA: Diagnosis not present

## 2024-02-24 DIAGNOSIS — R5383 Other fatigue: Secondary | ICD-10-CM | POA: Diagnosis not present

## 2024-02-24 DIAGNOSIS — E559 Vitamin D deficiency, unspecified: Secondary | ICD-10-CM | POA: Diagnosis not present

## 2024-02-25 DIAGNOSIS — I10 Essential (primary) hypertension: Secondary | ICD-10-CM | POA: Diagnosis not present

## 2024-02-25 DIAGNOSIS — E039 Hypothyroidism, unspecified: Secondary | ICD-10-CM | POA: Diagnosis not present

## 2024-02-25 DIAGNOSIS — R109 Unspecified abdominal pain: Secondary | ICD-10-CM | POA: Diagnosis not present

## 2024-02-25 DIAGNOSIS — E7841 Elevated Lipoprotein(a): Secondary | ICD-10-CM | POA: Diagnosis not present

## 2024-03-01 DIAGNOSIS — M25652 Stiffness of left hip, not elsewhere classified: Secondary | ICD-10-CM | POA: Diagnosis not present

## 2024-03-01 DIAGNOSIS — M25651 Stiffness of right hip, not elsewhere classified: Secondary | ICD-10-CM | POA: Diagnosis not present

## 2024-03-01 DIAGNOSIS — R29898 Other symptoms and signs involving the musculoskeletal system: Secondary | ICD-10-CM | POA: Diagnosis not present

## 2024-03-01 DIAGNOSIS — M170 Bilateral primary osteoarthritis of knee: Secondary | ICD-10-CM | POA: Diagnosis not present

## 2024-03-06 DIAGNOSIS — M25652 Stiffness of left hip, not elsewhere classified: Secondary | ICD-10-CM | POA: Diagnosis not present

## 2024-03-06 DIAGNOSIS — R29898 Other symptoms and signs involving the musculoskeletal system: Secondary | ICD-10-CM | POA: Diagnosis not present

## 2024-03-06 DIAGNOSIS — M25651 Stiffness of right hip, not elsewhere classified: Secondary | ICD-10-CM | POA: Diagnosis not present

## 2024-03-06 DIAGNOSIS — M170 Bilateral primary osteoarthritis of knee: Secondary | ICD-10-CM | POA: Diagnosis not present

## 2024-03-17 DIAGNOSIS — M25652 Stiffness of left hip, not elsewhere classified: Secondary | ICD-10-CM | POA: Diagnosis not present

## 2024-03-17 DIAGNOSIS — M25651 Stiffness of right hip, not elsewhere classified: Secondary | ICD-10-CM | POA: Diagnosis not present

## 2024-03-17 DIAGNOSIS — R29898 Other symptoms and signs involving the musculoskeletal system: Secondary | ICD-10-CM | POA: Diagnosis not present

## 2024-03-17 DIAGNOSIS — M170 Bilateral primary osteoarthritis of knee: Secondary | ICD-10-CM | POA: Diagnosis not present

## 2024-03-20 DIAGNOSIS — R29898 Other symptoms and signs involving the musculoskeletal system: Secondary | ICD-10-CM | POA: Diagnosis not present

## 2024-03-20 DIAGNOSIS — M170 Bilateral primary osteoarthritis of knee: Secondary | ICD-10-CM | POA: Diagnosis not present

## 2024-03-20 DIAGNOSIS — M25651 Stiffness of right hip, not elsewhere classified: Secondary | ICD-10-CM | POA: Diagnosis not present

## 2024-03-20 DIAGNOSIS — M25652 Stiffness of left hip, not elsewhere classified: Secondary | ICD-10-CM | POA: Diagnosis not present

## 2024-03-22 DIAGNOSIS — E039 Hypothyroidism, unspecified: Secondary | ICD-10-CM | POA: Diagnosis not present

## 2024-03-22 DIAGNOSIS — I10 Essential (primary) hypertension: Secondary | ICD-10-CM | POA: Diagnosis not present

## 2024-03-22 DIAGNOSIS — R7303 Prediabetes: Secondary | ICD-10-CM | POA: Diagnosis not present

## 2024-03-22 DIAGNOSIS — E785 Hyperlipidemia, unspecified: Secondary | ICD-10-CM | POA: Diagnosis not present

## 2024-03-27 DIAGNOSIS — M25651 Stiffness of right hip, not elsewhere classified: Secondary | ICD-10-CM | POA: Diagnosis not present

## 2024-03-27 DIAGNOSIS — M25652 Stiffness of left hip, not elsewhere classified: Secondary | ICD-10-CM | POA: Diagnosis not present

## 2024-03-27 DIAGNOSIS — M170 Bilateral primary osteoarthritis of knee: Secondary | ICD-10-CM | POA: Diagnosis not present

## 2024-03-27 DIAGNOSIS — R29898 Other symptoms and signs involving the musculoskeletal system: Secondary | ICD-10-CM | POA: Diagnosis not present

## 2024-04-05 DIAGNOSIS — M25651 Stiffness of right hip, not elsewhere classified: Secondary | ICD-10-CM | POA: Diagnosis not present

## 2024-04-05 DIAGNOSIS — R29898 Other symptoms and signs involving the musculoskeletal system: Secondary | ICD-10-CM | POA: Diagnosis not present

## 2024-04-05 DIAGNOSIS — M170 Bilateral primary osteoarthritis of knee: Secondary | ICD-10-CM | POA: Diagnosis not present

## 2024-04-05 DIAGNOSIS — M25652 Stiffness of left hip, not elsewhere classified: Secondary | ICD-10-CM | POA: Diagnosis not present

## 2024-04-06 ENCOUNTER — Encounter: Payer: Self-pay | Admitting: Sports Medicine

## 2024-04-10 DIAGNOSIS — M25512 Pain in left shoulder: Secondary | ICD-10-CM | POA: Diagnosis not present

## 2024-04-10 DIAGNOSIS — M170 Bilateral primary osteoarthritis of knee: Secondary | ICD-10-CM | POA: Diagnosis not present

## 2024-04-10 DIAGNOSIS — M25651 Stiffness of right hip, not elsewhere classified: Secondary | ICD-10-CM | POA: Diagnosis not present

## 2024-04-10 DIAGNOSIS — G8929 Other chronic pain: Secondary | ICD-10-CM | POA: Diagnosis not present

## 2024-04-10 DIAGNOSIS — R29898 Other symptoms and signs involving the musculoskeletal system: Secondary | ICD-10-CM | POA: Diagnosis not present

## 2024-04-10 DIAGNOSIS — M25652 Stiffness of left hip, not elsewhere classified: Secondary | ICD-10-CM | POA: Diagnosis not present

## 2024-04-23 ENCOUNTER — Ambulatory Visit
Admission: EM | Admit: 2024-04-23 | Discharge: 2024-04-23 | Disposition: A | Discharge status: Home / Self Care | Attending: Family Medicine | Admitting: Family Medicine

## 2024-04-23 ENCOUNTER — Other Ambulatory Visit: Payer: Self-pay

## 2024-04-23 ENCOUNTER — Encounter: Payer: Self-pay | Admitting: Emergency Medicine

## 2024-04-23 DIAGNOSIS — J069 Acute upper respiratory infection, unspecified: Secondary | ICD-10-CM

## 2024-04-23 MED ORDER — AZITHROMYCIN 250 MG PO TABS: 250.0000 mg | ORAL_TABLET | Freq: Every day | ORAL | 0 refills | Status: AC

## 2024-04-23 NOTE — Discharge Instructions (Addendum)
 Advised patient to take medication as directed with food to completion.  Encouraged to increase daily water intake to 64 ounces per day while taking this medication.  Advised if symptoms worsen and/or unresolved please follow-up with your PCP or here for further evaluation.

## 2024-04-23 NOTE — ED Provider Notes (Signed)
 [facility]    CSN: [id number] Arrival date & time: 04/23/24  1304      History   Chief Complaint Chief Complaint  Patient presents with   Cough    HPI Mercedes Reid is a 83 y.o. female.   HPI Very pleasant 83 year old female presents with cough and indigestion for 3-4 days.  PMH significant for PAF, CHF, and severe mitral valve regurgitation.  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 07/31/2019   Trigger index finger of right hand 06/15/2019   Primary osteoarthritis of both knees 04/07/2018   Chronic hip pain, bilateral 04/07/2018   Community acquired pneumonia of right middle lobe of lung 08/18/2016   Hypothyroidism (acquired) 06/26/2014   Disc degeneration, lumbosacral 02/25/2014   Fatty liver 02/25/2014   Neuralgia and neuritis 02/25/2014   Osteoarthritis 02/25/2014   Osteopenia 02/25/2014   Anxiety state 10/24/2013   H/O CHF    Thyroid  disease    Hypertension    Paroxysmal atrial fibrillation (HCC)    Severe mitral regurgitation    Mitral valve prolapse     Past Surgical History:  Procedure Laterality Date   ABDOMINAL HYSTERECTOMY     COX-MAZE MICROWAVE ABLATION  02/28/2009   Dr. Quin left side lesion set.   MITRAL VALVE REPAIR  03/01/2011   Quadrangular resection of posterior leaflet with leafet plication and 30-mm SorinMEMO 3D ring annuloplasty)                 OB History   No obstetric history on file.      Home Medications     Prior to Admission medications   Medication Sig Start Date End Date Taking? Authorizing Provider  azelastine  (OPTIVAR ) 0.05 % ophthalmic solution Place 2 drops into both eyes 2 (two) times daily. 11/25/21  Yes Curtis [name], MD  azithromycin  (ZITHROMAX ) 250 MG tablet Take 1 tablet (250 mg total) by mouth daily. Take first 2 tablets together, then 1 every day until finished. 04/23/24  Yes Teddy Sharper, FNP  Calcium Carbonate-Vitamin D 600-200 MG-UNIT TABS Take by mouth.   Yes [provider]  Cholecalciferol (VITAMIN D3) 1.25 MG (50000 UT) TABS Take 1.25 mg by mouth daily at 2 PM. OTC- pt is taking 2 daily   Yes [provider]  diclofenac  Sodium (VOLTAREN ) 1 % GEL Apply 4 g topically 4 (four) times daily. To both knees 05/14/21  Yes Curtis [name], MD  estradiol (ESTRACE) 1 MG tablet Take 1 mg by mouth daily.     Yes [provider]  gabapentin  (NEURONTIN ) 300 MG capsule One tab PO qHS for a week, then 2 tabs nightly for a week then 3 tabs nightly if needed 04/12/23  Yes Curtis [name], MD  levothyroxine (SYNTHROID) 100 MCG tablet Take 100 mcg by mouth daily before breakfast.   Yes [provider]  losartan (COZAAR) 100 MG tablet Take 100 mg by mouth  daily. 12/20/13  Yes [provider]  metoprolol succinate (TOPROL-XL) 25 MG 24 hr tablet Take 12.5 mg by mouth daily.   Yes [provider]  traMADol  (ULTRAM ) 50 MG tablet TAKE 1-2 TABLETS (50-100 MG TOTAL) BY MOUTH 3 (THREE) TIMES DAILY AS NEEDED. 07/26/23  Yes Curtis [name], MD  triamterene-hydrochlorothiazide (DYAZIDE) 37.5-25 MG capsule Take 1 capsule by mouth daily.   Yes [provider]  fexofenadine  (ALLEGRA  ALLERGY) 180 MG tablet Take 1 tablet (180 mg total) by mouth daily. 09/17/21 10/17/21  Teddy Sharper, FNP    Family History Family History  Problem Relation Age of Onset   Heart failure Father     Social History Social History   Tobacco  Use   Smoking status: Never   Smokeless tobacco: Never  Vaping Use   Vaping status: Never Used  Substance Use Topics   Alcohol use: No   Drug use: No     Allergies   Latex, Other, Sulfa antibiotics, and Amoxicillin-pot clavulanate   Review of Systems Review of Systems  Respiratory:  Positive for cough.   All other systems reviewed and are negative.    Physical Exam Triage Vital Signs ED Triage Vitals  Encounter Vitals Group     BP      Girls Systolic BP Percentile      Girls Diastolic BP Percentile      Boys Systolic BP Percentile      Boys Diastolic BP Percentile      Pulse      Resp      Temp      Temp src      SpO2      Weight      Height      Head Circumference      Peak Flow      Pain Score      Pain Loc      Pain Education      Exclude from Growth Chart    No data found.  Updated Vital Signs BP 124/77 (BP Location: Right Arm)   Pulse 60   Temp 98.2 F (36.8 C) (Oral)   Resp 18   SpO2 95%    Physical Exam Vitals and nursing note reviewed.  Constitutional:      Appearance: Normal appearance. She is normal weight.  HENT:     Head: Normocephalic and atraumatic.     Right Ear: Tympanic membrane, ear canal and external ear normal.     Left Ear: Tympanic membrane, ear canal and external ear normal.     Mouth/Throat:     Mouth: Mucous membranes are moist.     Pharynx: Oropharynx is clear.  Eyes:     Extraocular Movements: Extraocular movements intact.     Pupils: Pupils are equal, round, and reactive to light.  Cardiovascular:     Rate and Rhythm: Normal rate and regular rhythm.     Pulses: Normal pulses.     Heart sounds: Normal heart sounds.  Pulmonary:     Effort: Pulmonary effort is normal.     Breath sounds: Normal breath sounds. No wheezing, rhonchi or rales.     Comments: Infrequent nonproductive cough on exam Musculoskeletal:        General: Normal range of motion.  Skin:    General: Skin is warm and dry.  Neurological:      General: No focal deficit present.     Mental Status: She is alert and oriented to person, place, and time.  Mental status is at baseline.  Psychiatric:        Mood and Affect: Mood normal.        Behavior: Behavior normal.      UC Treatments / Results  Labs (all labs ordered are listed, but only abnormal results are displayed) Labs Reviewed - No data to display  EKG   Radiology No results found.  Procedures Procedures (including critical care time)  Medications Ordered in UC Medications - No data to display  Initial Impression / Assessment and Plan / UC Course  I have reviewed the triage vital signs and the nursing notes.  Pertinent labs & imaging results that were available during my care of the patient were reviewed by me and considered in my medical decision making (see chart for details).     MDM: 1.  Acute URI-Rx'd Zithromax : Take as directed. Advised patient to take medication as directed with food to completion.  Encouraged to increase daily water intake to 64 ounces per day while taking this medication.  Advised if symptoms worsen and/or unresolved please follow-up with your PCP or here for further evaluation.  Patient discharged home, hemodynamically stable. Final Clinical Impressions(s) / UC Diagnoses   Final diagnoses:  Acute upper respiratory infection     Discharge Instructions      Advised patient to take medication as directed with food to completion.  Encouraged to increase daily water intake to 64 ounces per day while taking this medication.  Advised if symptoms worsen and/or unresolved please follow-up with your PCP or here for further evaluation.     ED Prescriptions     Medication Sig Dispense Auth. Provider   azithromycin  (ZITHROMAX ) 250 MG tablet Take 1 tablet (250 mg total) by mouth daily. Take first 2 tablets together, then 1 every day until finished. 6 tablet Taylormarie Register, FNP      PDMP not reviewed this encounter.   Teddy Sharper,  FNP 04/23/24 1423

## 2024-04-23 NOTE — ED Triage Notes (Signed)
 Patient c/o cough and indigestion for 3-4 days.  Patient states she's been burping a lot feels like she slept wrong on the left side of her neck area.  Some SOB.  No history of reflux.  Denies any OTC cold/indigestion pills.

## 2024-06-07 ENCOUNTER — Ambulatory Visit: Payer: Self-pay | Admitting: *Deleted

## 2024-06-07 NOTE — Telephone Encounter (Signed)
 FYI Only or Action Required?: Action required by provider: request for appointment and requesting shot for hip pain.  Patient was last seen in primary care on 11/18/2023 by Curtis [name], MD.  Called Nurse Triage reporting Hip Pain.  Symptoms began several days ago.  Interventions attempted: Rest, hydration, or home remedies.  Symptoms are: gradually worsening.  Triage Disposition: See PCP When Office is Open (Within 3 Days)  Patient/caregiver understands and will follow disposition?: Unsure   Recommended calling patient's PCP or going to [facility]. [facility]ini no longer at [facility].          Copied from CRM [id number]. Topic: Clinical - Red Word Triage >> Jun 07, 2024  9:01 AM Amy B wrote: Red Word that prompted transfer to Nurse Triage: Left hip pain, soreness to touch and with walking Reason for Disposition  [1] MODERATE pain (e.g., interferes with normal activities, limping) AND [2] present > 3 days  Answer Assessment - Initial Assessment Questions Recommended contacting PCP or go to [facility]. Gave # for [place] location. Patient previously seen by Dr. ONEIDA and [facility] for [facility] med issues. Provider no longer at practice and patient did not want to [facility] to this location. Patient reports she will contact [facility].       1. LOCATION and RADIATION: Where is the pain located? Does the pain spread (shoot) anywhere else?     Left hip pain and buttocks  2. QUALITY: What does the pain feel like?  (e.g., sharp, dull, aching, burning)     Like hit  quickly , soreness  3. SEVERITY: How bad is the pain? What does it keep you from doing?   (Scale 1-10; or mild, moderate, severe)     6/10  4. ONSET: When did the pain start? Does it come and go, or is it there all the time?     3-4 days  5. WORK OR EXERCISE: Has there been any recent work or exercise that involved this part of the body?      na 6. CAUSE: What do you  think is causing the hip pain?      Not sure  7. AGGRAVATING FACTORS: What makes the hip pain worse? (e.g., walking, climbing stairs, running)     Walking , can not go up stairs , uses walker  8. OTHER SYMPTOMS: Do you have any other symptoms? (e.g., back pain, pain shooting down leg,  fever, rash)     Left hip pain constant worsening with sitting , pain with standing and walking  Protocols used: Hip Pain-A-AH
